# Patient Record
Sex: Male | Born: 1965 | Race: White | Hispanic: No | Marital: Married | Smoking: Never smoker
Health system: Southern US, Community
[De-identification: ages and names within clinical notes are randomized; demographics above are authoritative.]

## PROBLEM LIST (undated history)

## (undated) DIAGNOSIS — Z9109 Other allergy status, other than to drugs and biological substances: Secondary | ICD-10-CM

## (undated) DIAGNOSIS — K219 Gastro-esophageal reflux disease without esophagitis: Secondary | ICD-10-CM

## (undated) DIAGNOSIS — G4730 Sleep apnea, unspecified: Secondary | ICD-10-CM

## (undated) DIAGNOSIS — C61 Malignant neoplasm of prostate: Secondary | ICD-10-CM

## (undated) DIAGNOSIS — R972 Elevated prostate specific antigen [PSA]: Secondary | ICD-10-CM

---
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
## 1997-12-25 ENCOUNTER — Emergency Department (HOSPITAL_COMMUNITY): Admission: EM | Admit: 1997-12-25 | Discharge: 1997-12-25 | Payer: Self-pay

## 2001-12-03 ENCOUNTER — Ambulatory Visit (HOSPITAL_COMMUNITY): Admission: RE | Admit: 2001-12-03 | Discharge: 2001-12-03 | Payer: Self-pay | Admitting: Gastroenterology

## 2001-12-22 ENCOUNTER — Ambulatory Visit (HOSPITAL_COMMUNITY): Admission: RE | Admit: 2001-12-22 | Discharge: 2001-12-22 | Payer: Self-pay | Admitting: Gastroenterology

## 2002-10-06 ENCOUNTER — Ambulatory Visit (HOSPITAL_COMMUNITY): Admission: RE | Admit: 2002-10-06 | Discharge: 2002-10-06 | Payer: Self-pay | Admitting: Gastroenterology

## 2003-09-23 ENCOUNTER — Emergency Department (HOSPITAL_COMMUNITY): Admission: EM | Admit: 2003-09-23 | Discharge: 2003-09-23 | Payer: Self-pay | Admitting: Emergency Medicine

## 2004-06-08 ENCOUNTER — Emergency Department (HOSPITAL_COMMUNITY): Admission: EM | Admit: 2004-06-08 | Discharge: 2004-06-08 | Payer: Self-pay | Admitting: Emergency Medicine

## 2004-10-21 ENCOUNTER — Emergency Department (HOSPITAL_COMMUNITY): Admission: EM | Admit: 2004-10-21 | Discharge: 2004-10-21 | Payer: Self-pay | Admitting: Emergency Medicine

## 2005-03-18 ENCOUNTER — Ambulatory Visit (HOSPITAL_BASED_OUTPATIENT_CLINIC_OR_DEPARTMENT_OTHER): Admission: RE | Admit: 2005-03-18 | Discharge: 2005-03-18 | Payer: Self-pay | Admitting: Family Medicine

## 2005-03-23 ENCOUNTER — Ambulatory Visit: Payer: Self-pay | Admitting: Internal Medicine

## 2011-04-02 ENCOUNTER — Emergency Department (HOSPITAL_COMMUNITY)
Admission: EM | Admit: 2011-04-02 | Discharge: 2011-04-03 | Disposition: A | Discharge status: Home / Self Care | Payer: BC Managed Care – PPO | Attending: Emergency Medicine | Admitting: Emergency Medicine

## 2011-04-02 DIAGNOSIS — S61219A Laceration without foreign body of unspecified finger without damage to nail, initial encounter: Secondary | ICD-10-CM

## 2011-04-02 DIAGNOSIS — S61209A Unspecified open wound of unspecified finger without damage to nail, initial encounter: Secondary | ICD-10-CM | POA: Insufficient documentation

## 2011-04-02 DIAGNOSIS — W261XXA Contact with sword or dagger, initial encounter: Secondary | ICD-10-CM | POA: Insufficient documentation

## 2011-04-02 DIAGNOSIS — W260XXA Contact with knife, initial encounter: Secondary | ICD-10-CM | POA: Insufficient documentation

## 2011-04-02 HISTORY — DX: Other allergy status, other than to drugs and biological substances: Z91.09

## 2011-04-02 HISTORY — DX: Sleep apnea, unspecified: G47.30

## 2011-04-02 NOTE — ED Notes (Signed)
Cut to LT index finger w/ paring knife.  Small lac to top of finger-bleeding controlled.

## 2011-04-02 NOTE — ED Notes (Signed)
Ice pack given to ease pain.  Pt has gauze around finger

## 2011-04-03 MED ORDER — LIDOCAINE HCL 2 % IJ SOLN
INTRAMUSCULAR | Status: AC
  Administered 2011-04-03: 01:00:00
  Filled 2011-04-03: qty 1

## 2011-04-03 MED ORDER — TETANUS-DIPHTH-ACELL PERTUSSIS 5-2.5-18.5 LF-MCG/0.5 IM SUSP
0.5000 mL | Freq: Once | INTRAMUSCULAR | Status: AC
  Administered 2011-04-03: 0.5 mL via INTRAMUSCULAR
  Filled 2011-04-03: qty 0.5

## 2011-04-03 NOTE — ED Provider Notes (Signed)
History     CSN: [id number] Arrival date & time: 04/02/2011  8:45 PM   First MD Initiated Contact with Patient 04/03/11 0110      Chief Complaint  Patient presents with  . Extremity Laceration    LT index finger    (Consider location/radiation/quality/duration/timing/severity/associated sxs/prior treatment) Patient is a 45 y.o. male presenting with skin laceration. The history is provided by the patient.  Laceration  The incident occurred 6 to 12 hours ago. The laceration is located on the left hand. The laceration is 2 cm in size. The laceration mechanism was a a clean knife. The pain is mild. His tetanus status is out of date.    Past Medical History  Diagnosis Date  . Environmental allergies   . Sleep apnea     No past surgical history on file.  No family history on file.  History  Substance Use Topics  . Smoking status: Never Smoker   . Smokeless tobacco: Not on file  . Alcohol Use: 1.8 oz/week    3 Cans of beer per week     daily      Review of Systems  Constitutional: Negative.   Respiratory: Negative.   Cardiovascular: Negative.   Musculoskeletal: Negative.   Skin:       See HPI.  Neurological: Negative.     Allergies  Review of patient's allergies indicates no known allergies.  Home Medications   Current Outpatient Rx  Name Route Sig Dispense Refill  . CETIRIZINE HCL 10 MG PO TABS Oral Take 10 mg by mouth daily.      Marland Kitchen VITAMIN D 1000 UNITS PO TABS Oral Take 1,000 Units by mouth daily.      . OMEGA-3 FATTY ACIDS 1000 MG PO CAPS Oral Take 1 g by mouth daily.      Marland Kitchen FLUTICASONE PROPIONATE 50 MCG/ACT NA SUSP Nasal Place 2 sprays into the nose daily.      . ADULT GUMMY PO CHEW Oral Chew 1 tablet by mouth daily.        BP 126/72  Pulse 59  Temp 98.5 F (36.9 C)  Resp 20  SpO2 98%  Physical Exam  Constitutional: He is oriented to person, place, and time. He appears well-developed and well-nourished.  Neck: Normal range of motion.    Pulmonary/Chest: Effort normal.  Musculoskeletal: Normal range of motion.       2 cm lac to dorsolateral left index finger. FROM without tendon deficits.  Neurological: He is alert and oriented to person, place, and time.  Skin: Skin is warm and dry.  Psychiatric: He has a normal mood and affect.    ED Course  Procedures (including critical care time)  Labs Reviewed - No data to display No results found.   No diagnosis found.    MDM  LACERATION REPAIR Performed by: Rodena Medin Authorized by: Langley Adie A Consent: Verbal consent obtained. Risks and benefits: risks, benefits and alternatives were discussed Consent given by: patient Patient identity confirmed: provided demographic data Prepped and Draped in normal sterile fashion Wound explored  Laceration Location: left index finger distally  Laceration Length: 2cm  No Foreign Bodies seen or palpated  Anesthesia: local infiltration  Local anesthetic: lidocaine 1% w/o epinephrine  Anesthetic total: 1 ml  Irrigation method: syringe Amount of cleaning: standard  Skin closure: 5-0 nylon  Number of sutures: 2  Technique: simple interrupted  Patient tolerance: Patient tolerated the procedure well with no immediate complications.  Rodena Medin, PA 04/03/11 [phone]

## 2011-04-03 NOTE — ED Provider Notes (Signed)
Medical screening examination/treatment/procedure(s) were performed by non-physician practitioner and as supervising physician I was immediately available for consultation/collaboration.  Taisei Bonnette M Sly Parlee, MD 04/03/11 0810 

## 2012-02-17 ENCOUNTER — Other Ambulatory Visit: Payer: Self-pay | Admitting: Family Medicine

## 2012-02-17 ENCOUNTER — Ambulatory Visit
Admission: RE | Admit: 2012-02-17 | Discharge: 2012-02-17 | Disposition: A | Discharge status: Home / Self Care | Payer: No Typology Code available for payment source | Source: Ambulatory Visit | Attending: Family Medicine | Admitting: Family Medicine

## 2012-02-17 DIAGNOSIS — Z0000 Encounter for general adult medical examination without abnormal findings: Secondary | ICD-10-CM

## 2013-06-09 ENCOUNTER — Encounter: Payer: Self-pay | Admitting: General Surgery

## 2013-06-09 DIAGNOSIS — R9431 Abnormal electrocardiogram [ECG] [EKG]: Secondary | ICD-10-CM

## 2013-06-26 IMAGING — CR DG CHEST 2V
2 series · 2 of 2 positions shown · non-contrast
Comparison: None.

CLINICAL DATA: Dive physical exam

CHEST - 2 VIEW

[w chest pa]
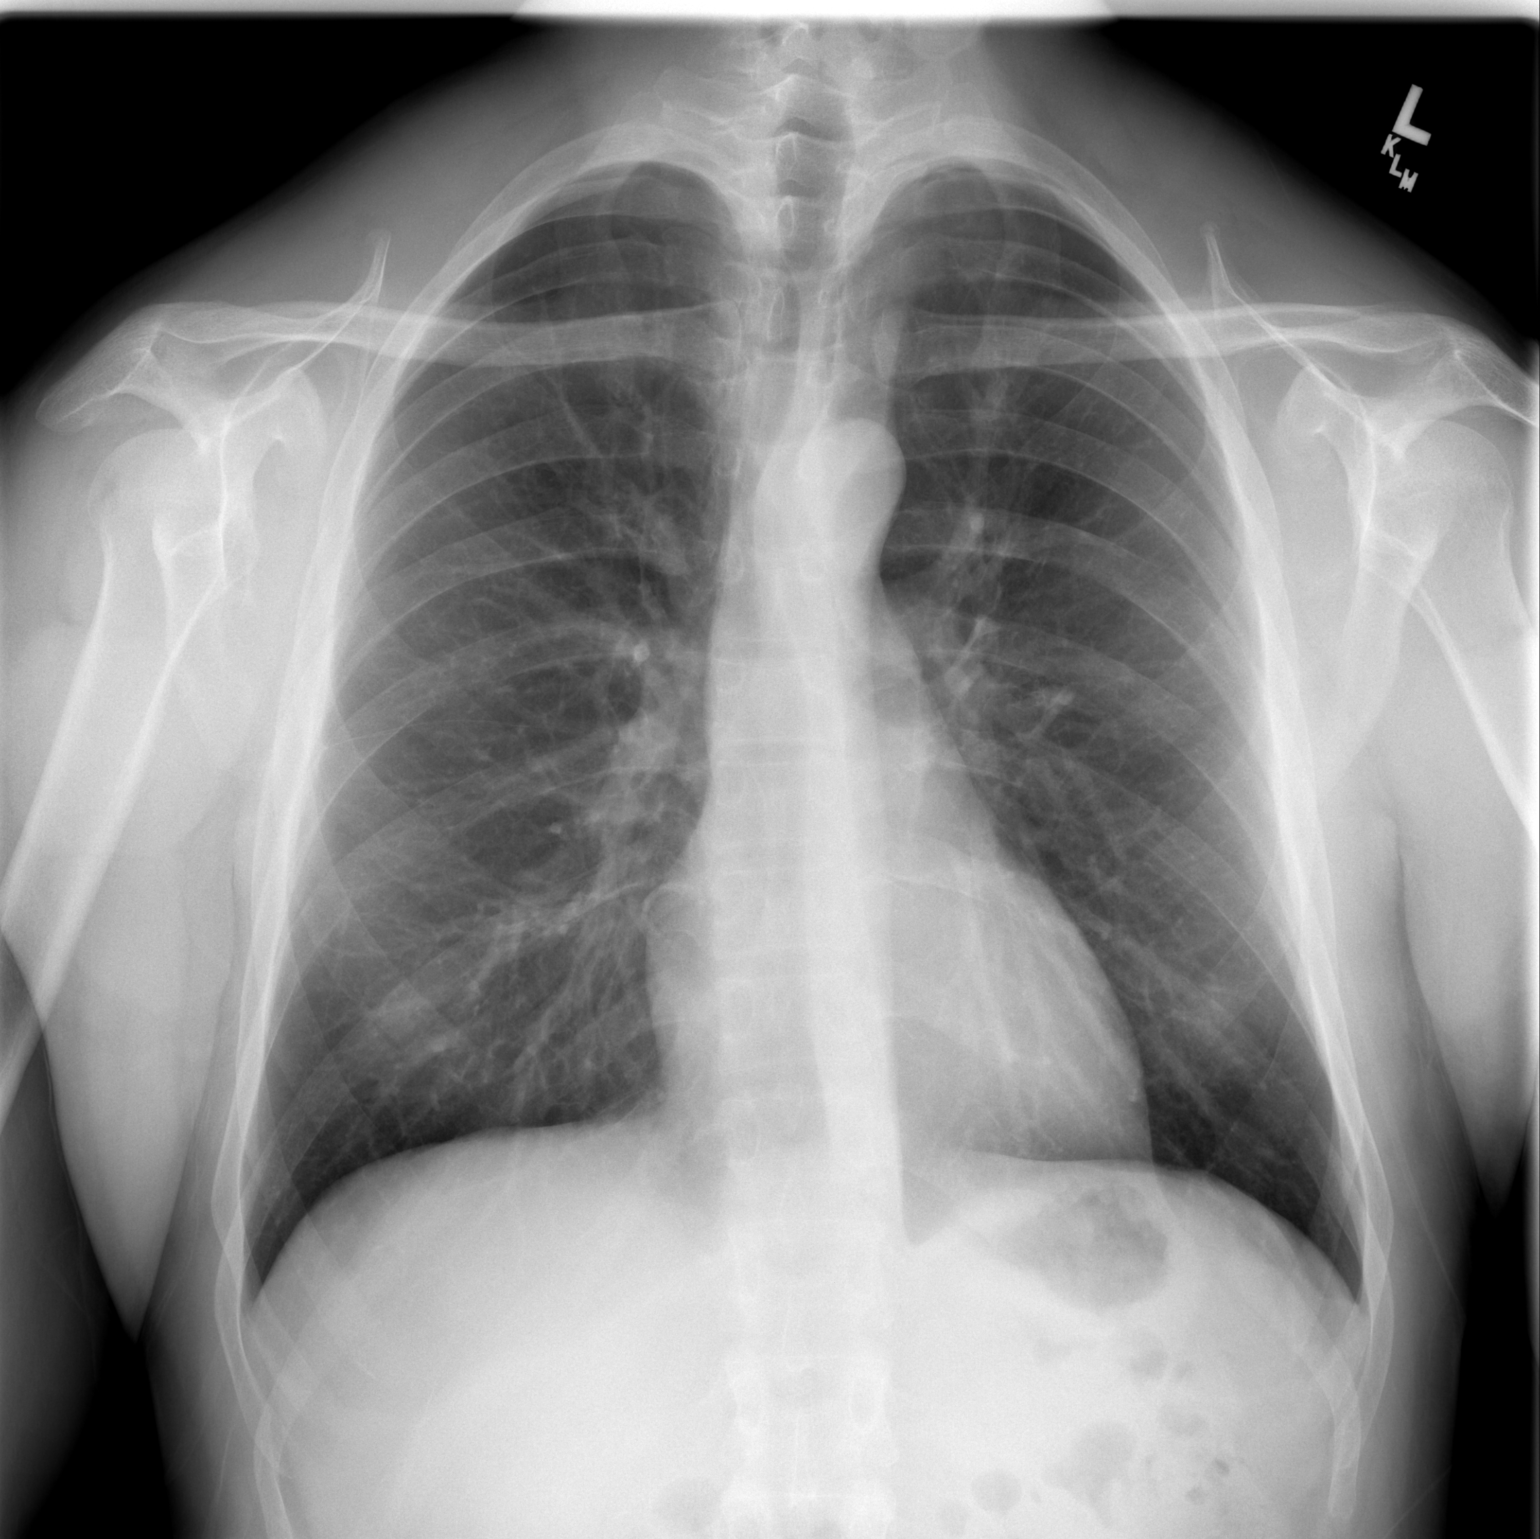

[w chest lat]
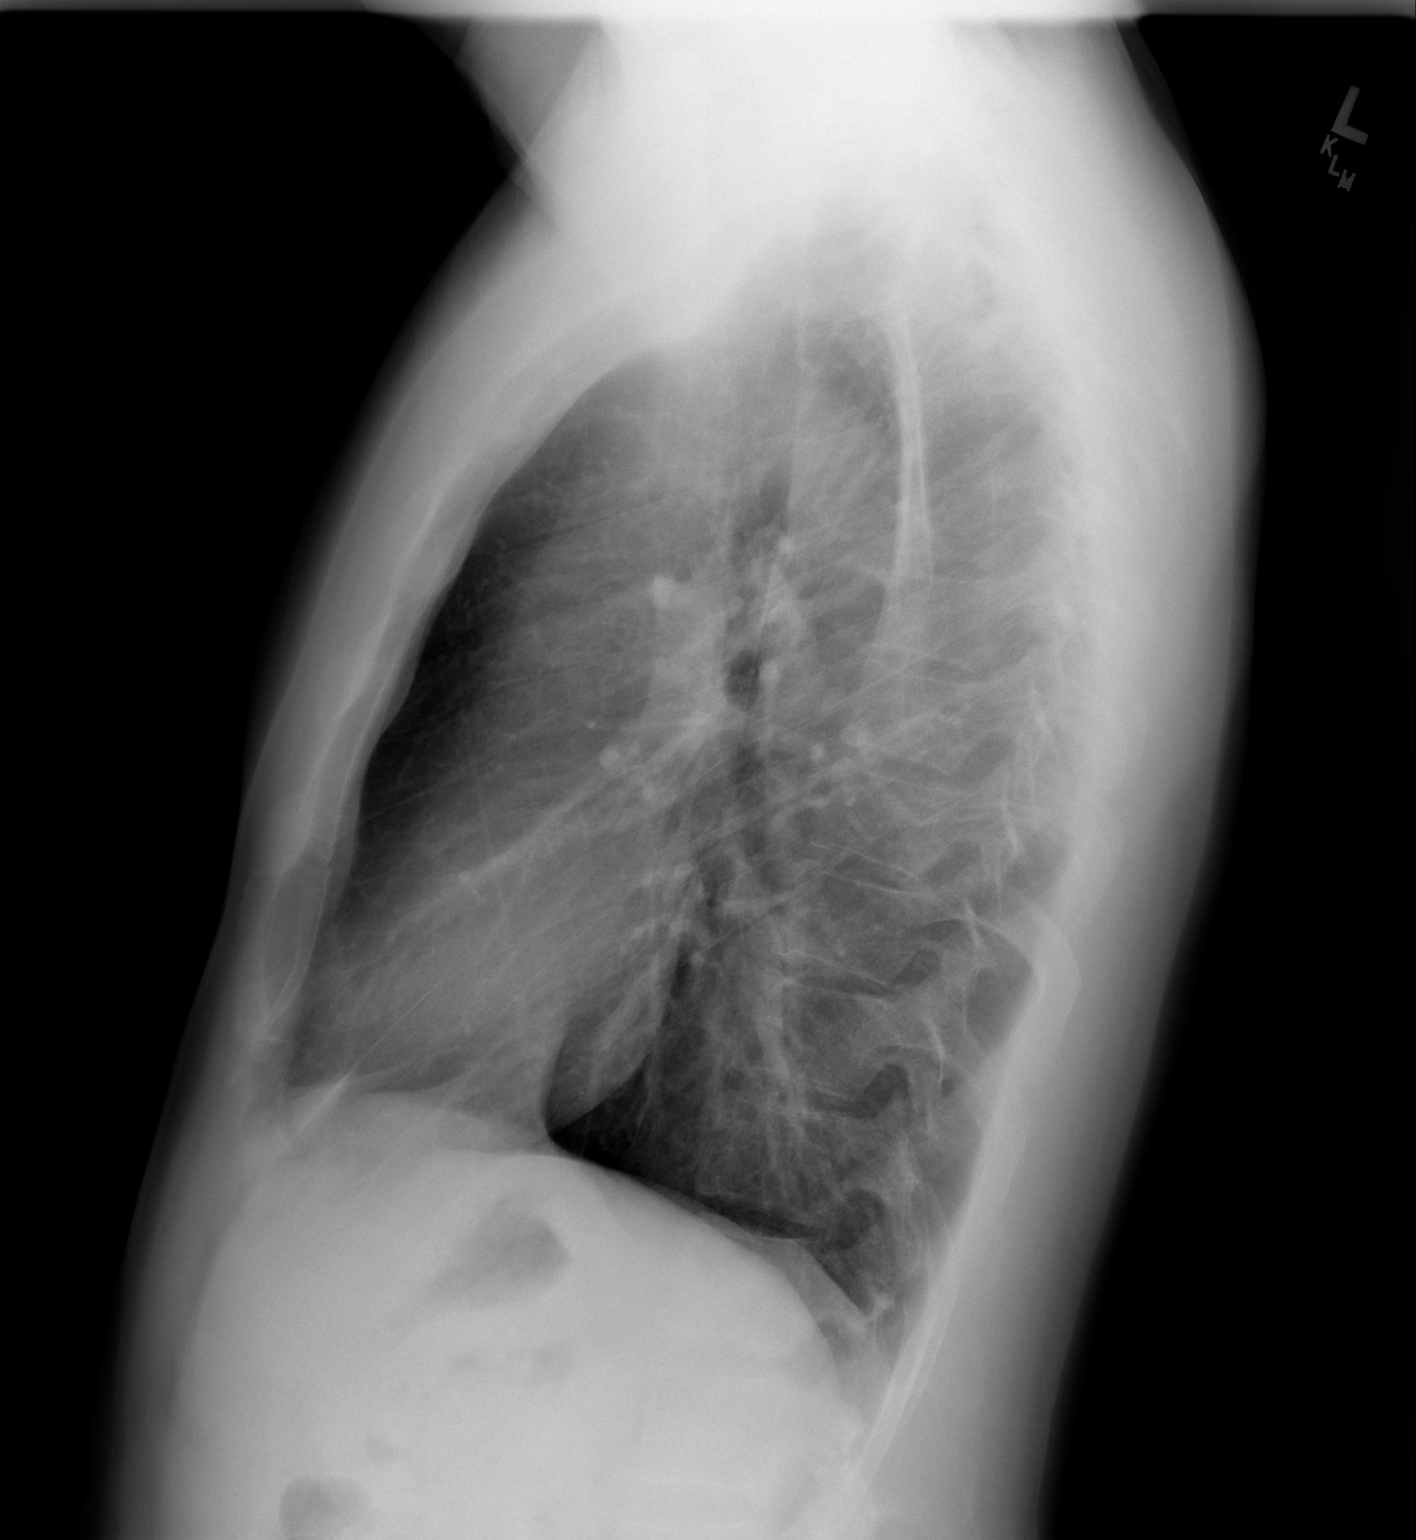

[2 of 2 positions shown; findings below may reference images not displayed]

FINDINGS: The heart size and mediastinal contours are within
normal limits.  Both lungs are clear.  The visualized skeletal
structures are unremarkable.
IMPRESSION: No active cardiopulmonary disease.

## 2014-07-06 ENCOUNTER — Other Ambulatory Visit: Payer: Self-pay | Admitting: Gastroenterology

## 2015-09-26 ENCOUNTER — Encounter (HOSPITAL_COMMUNITY): Payer: Self-pay | Admitting: Family Medicine

## 2015-09-26 ENCOUNTER — Emergency Department (HOSPITAL_COMMUNITY)
Admission: EM | Admit: 2015-09-26 | Discharge: 2015-09-26 | Disposition: A | Discharge status: Home / Self Care | Payer: Worker's Compensation | Attending: Emergency Medicine | Admitting: Emergency Medicine

## 2015-09-26 DIAGNOSIS — Z79899 Other long term (current) drug therapy: Secondary | ICD-10-CM | POA: Diagnosis not present

## 2015-09-26 DIAGNOSIS — IMO0002 Reserved for concepts with insufficient information to code with codable children: Secondary | ICD-10-CM

## 2015-09-26 DIAGNOSIS — Y9289 Other specified places as the place of occurrence of the external cause: Secondary | ICD-10-CM | POA: Diagnosis not present

## 2015-09-26 DIAGNOSIS — Z7951 Long term (current) use of inhaled steroids: Secondary | ICD-10-CM | POA: Diagnosis not present

## 2015-09-26 DIAGNOSIS — Y9389 Activity, other specified: Secondary | ICD-10-CM | POA: Diagnosis not present

## 2015-09-26 DIAGNOSIS — W293XXA Contact with powered garden and outdoor hand tools and machinery, initial encounter: Secondary | ICD-10-CM | POA: Diagnosis not present

## 2015-09-26 DIAGNOSIS — S81012A Laceration without foreign body, left knee, initial encounter: Secondary | ICD-10-CM | POA: Diagnosis not present

## 2015-09-26 DIAGNOSIS — Y998 Other external cause status: Secondary | ICD-10-CM | POA: Diagnosis not present

## 2015-09-26 DIAGNOSIS — Z8669 Personal history of other diseases of the nervous system and sense organs: Secondary | ICD-10-CM | POA: Insufficient documentation

## 2015-09-26 MED ORDER — BACITRACIN ZINC 500 UNIT/GM EX OINT: 1.0000 "application " | TOPICAL_OINTMENT | Freq: Two times a day (BID) | CUTANEOUS | Status: DC

## 2015-09-26 MED ORDER — LIDOCAINE-EPINEPHRINE (PF) 2 %-1:200000 IJ SOLN
20.0000 mL | Freq: Once | INTRAMUSCULAR | Status: AC
  Administered 2015-09-26: 20 mL
  Filled 2015-09-26: qty 20

## 2015-09-26 NOTE — ED Provider Notes (Signed)
CSN: [id number]     Arrival date & time 09/26/15  1214 History  By signing my name below, I, Essence Howell, attest that this documentation has been prepared under the direction and in the presence of Roxy Horsemanobert Chalice Philbert, PA-C. Electronically Signed: Charline BillsEssence Howell, ED Scribe 09/26/2015 at 12:34 PM.  Chief Complaint  Patient presents with  . Extremity Laceration   (Consider location/radiation/quality/duration/timing/severity/associated sxs/prior Treatment) The history is provided by the patient. No language interpreter was used.   HPI Comments: Clayton Smith is a 50 y.o. male who presents to the Emergency Department with a chief complaint of a laceration sustained to his left knee PTA. Pt states he cut it with a chainsaw while he was cutting trees. Pt states his last tetanus shot was given in 2012. His bleeding is controlled. Pt denies any other complaints. Denies any difficulty moving his knee.  Past Medical History  Diagnosis Date  . Environmental allergies   . Sleep apnea    No past surgical history on file. No family history on file. Social History  Substance Use Topics  . Smoking status: Never Smoker   . Smokeless tobacco: Not on file  . Alcohol Use: 1.8 oz/week    3 Cans of beer per week     Comment: daily    Review of Systems  Constitutional: Negative for fever.  Skin: Positive for wound.      Allergies  Review of patient's allergies indicates no known allergies.  Home Medications   Prior to Admission medications   Medication Sig Start Date End Date Taking? Authorizing Provider  cetirizine (ZYRTEC) 10 MG tablet Take 10 mg by mouth daily.      Historical Provider, MD  cholecalciferol (VITAMIN D) 1000 UNITS tablet Take 1,000 Units by mouth daily.      Historical Provider, MD  fish oil-omega-3 fatty acids 1000 MG capsule Take 1 g by mouth daily.      Historical Provider, MD  fluticasone (FLONASE) 50 MCG/ACT nasal spray Place 2 sprays into the nose daily.       Historical Provider, MD  Multiple Vitamins-Minerals (ADULT GUMMY) CHEW Chew 1 tablet by mouth daily.      Historical Provider, MD   Triage Vitals: BP 143/85 mmHg  Pulse 60  Temp(Src) 98 F (36.7 C) (Oral)  Resp 20  SpO2 100% Physical Exam Physical Exam  Constitutional: Pt appears well-developed and well-nourished. No distress.  HENT:  Head: Normocephalic and atraumatic.  Eyes: Conjunctivae are normal.  Neck: Normal range of motion.  Cardiovascular: Normal rate, regular rhythm and intact distal pulses.   Capillary refill < 3 sec  Pulmonary/Chest: Effort normal and breath sounds normal.  Musculoskeletal: Pt exhibits tenderness. Pt exhibits no edema.  ROM: of left knee 5/5 Neurological: Pt  is alert. Coordination normal.  Sensation 5/5 Strength 5/5  Skin: Skin is warm and dry. Pt is not diaphoretic.  No tenting of the skin  Laceration to anterior left knee, no foreign body, it is not deep, no tendon or ligament involvement, no involvement of the joint, the bottom of the wound is easily visualized Psychiatric: Pt has a normal mood and affect.  Nursing note and vitals reviewed.  ED Course  Procedures  DIAGNOSTIC STUDIES: Oxygen Saturation is 100% on RA, normal by my interpretation.    COORDINATION OF CARE: LACERATION REPAIR Performed by: Laurie Pandaichie Mittan, PA-S Authorized by: Roxy HorsemanBROWNING, Abdiaziz Klahn Consent: Verbal consent obtained. Risks and benefits: risks, benefits and alternatives were discussed Consent given by: patient Patient identity confirmed:  provided demographic data Prepped and Draped in normal sterile fashion Wound explored  Laceration Location: left knee  Laceration Length: 6 cm  No Foreign Bodies seen or palpated  Anesthesia: local infiltration  Local anesthetic: lidocaine 2% with epinephrine  Anesthetic total: 6 ml  Irrigation method: syringe Amount of cleaning: standard  Skin closure: 3-0 prolene  Number of sutures: 5  Technique: 4 horizontal  mattress, 1 simple interrupted  Patient tolerance: Patient tolerated the procedure well with no immediate complications.    MDM   Final diagnoses:  Laceration    Patient with laceration over anterior left knee, no bony involvement, no deep tissue, tendon, or ligament involvement. No evidence of joint capsule involvement. Patient has normal range of motion and strength left knee. There are no foreign bodies. The wound was copiously irrigated and repaired in the emergency department. Recommend sutures out in 14 days. Advised patient to limit flexion of left knee. Tetanus shot is up-to-date. Return precautions given. Patient is stable and ready for discharge.  I personally performed the services described in this documentation, which was scribed in my presence. The recorded information has been reviewed and is accurate.      Roxy Horseman, PA-C 09/26/15 1335  Rolland Porter, MD 10/06/15 2101

## 2015-09-26 NOTE — ED Notes (Signed)
Waiting for work note with restrictions.

## 2015-09-26 NOTE — ED Notes (Signed)
Dressing placed over wound with bacitracin.

## 2015-09-26 NOTE — Discharge Instructions (Signed)
Your sutures need to be removed in 14 days.  Return for redness, swelling, pus, or signs of infection.  Be careful in walking and bending your knee, you do not want to tear through the stitches.  Laceration Care, Adult A laceration is a cut that goes through all of the layers of the skin and into the tissue that is right under the skin. Some lacerations heal on their own. Others need to be closed with stitches (sutures), staples, skin adhesive strips, or skin glue. Proper laceration care minimizes the risk of infection and helps the laceration to heal better. HOW TO CARE FOR YOUR LACERATION If sutures or staples were used:  Keep the wound clean and dry.  If you were given a bandage (dressing), you should change it at least one time per day or as told by your health care provider. You should also change it if it becomes wet or dirty.  Keep the wound completely dry for the first 24 hours or as told by your health care provider. After that time, you may shower or bathe. However, make sure that the wound is not soaked in water until after the sutures or staples have been removed.  Clean the wound one time each day or as told by your health care provider:  Wash the wound with soap and water.  Rinse the wound with water to remove all soap.  Pat the wound dry with a clean towel. Do not rub the wound.  After cleaning the wound, apply a thin layer of antibiotic ointmentas told by your health care provider. This will help to prevent infection and keep the dressing from sticking to the wound.  Have the sutures or staples removed as told by your health care provider. If skin adhesive strips were used:  Keep the wound clean and dry.  If you were given a bandage (dressing), you should change it at least one time per day or as told by your health care provider. You should also change it if it becomes dirty or wet.  Do not get the skin adhesive strips wet. You may shower or bathe, but be careful to  keep the wound dry.  If the wound gets wet, pat it dry with a clean towel. Do not rub the wound.  Skin adhesive strips fall off on their own. You may trim the strips as the wound heals. Do not remove skin adhesive strips that are still stuck to the wound. They will fall off in time. If skin glue was used:  Try to keep the wound dry, but you may briefly wet it in the shower or bath. Do not soak the wound in water, such as by swimming.  After you have showered or bathed, gently pat the wound dry with a clean towel. Do not rub the wound.  Do not do any activities that will make you sweat heavily until the skin glue has fallen off on its own.  Do not apply liquid, cream, or ointment medicine to the wound while the skin glue is in place. Using those may loosen the film before the wound has healed.  If you were given a bandage (dressing), you should change it at least one time per day or as told by your health care provider. You should also change it if it becomes dirty or wet.  If a dressing is placed over the wound, be careful not to apply tape directly over the skin glue. Doing that may cause the glue to be  pulled off before the wound has healed.  Do not pick at the glue. The skin glue usually remains in place for 5-10 days, then it falls off of the skin. General Instructions  Take over-the-counter and prescription medicines only as told by your health care provider.  If you were prescribed an antibiotic medicine or ointment, take or apply it as told by your doctor. Do not stop using it even if your condition improves.  To help prevent scarring, make sure to cover your wound with sunscreen whenever you are outside after stitches are removed, after adhesive strips are removed, or when glue remains in place and the wound is healed. Make sure to wear a sunscreen of at least 30 SPF.  Do not scratch or pick at the wound.  Keep all follow-up visits as told by your health care provider. This is  important.  Check your wound every day for signs of infection. Watch for:  Redness, swelling, or pain.  Fluid, blood, or pus.  Raise (elevate) the injured area above the level of your heart while you are sitting or lying down, if possible. SEEK MEDICAL CARE IF:  You received a tetanus shot and you have swelling, severe pain, redness, or bleeding at the injection site.  You have a fever.  A wound that was closed breaks open.  You notice a bad smell coming from your wound or your dressing.  You notice something coming out of the wound, such as wood or glass.  Your pain is not controlled with medicine.  You have increased redness, swelling, or pain at the site of your wound.  You have fluid, blood, or pus coming from your wound.  You notice a change in the color of your skin near your wound.  You need to change the dressing frequently due to fluid, blood, or pus draining from the wound.  You develop a new rash.  You develop numbness around the wound. SEEK IMMEDIATE MEDICAL CARE IF:  You develop severe swelling around the wound.  Your pain suddenly increases and is severe.  You develop painful lumps near the wound or on skin that is anywhere on your body.  You have a red streak going away from your wound.  The wound is on your hand or foot and you cannot properly move a finger or toe.  The wound is on your hand or foot and you notice that your fingers or toes look pale or bluish.   This information is not intended to replace advice given to you by your health care provider. Make sure you discuss any questions you have with your health care provider.   Document Released: 04/21/2005 Document Revised: 09/05/2014 Document Reviewed: 04/17/2014 Elsevier Interactive Patient Education [employer]

## 2015-09-26 NOTE — ED Notes (Signed)
Pt presents from home via POV with c/o laceration to left knee from a chainsaw - he was cutting up some fallen trees in his yard.  Pt is A&O, PMS intact.  Edges are well approximated.

## 2016-04-18 DIAGNOSIS — G4733 Obstructive sleep apnea (adult) (pediatric): Secondary | ICD-10-CM | POA: Diagnosis not present

## 2016-05-20 DIAGNOSIS — Z125 Encounter for screening for malignant neoplasm of prostate: Secondary | ICD-10-CM | POA: Diagnosis not present

## 2016-05-20 DIAGNOSIS — Z23 Encounter for immunization: Secondary | ICD-10-CM | POA: Diagnosis not present

## 2016-05-20 DIAGNOSIS — R7301 Impaired fasting glucose: Secondary | ICD-10-CM | POA: Diagnosis not present

## 2016-05-20 DIAGNOSIS — Z0000 Encounter for general adult medical examination without abnormal findings: Secondary | ICD-10-CM | POA: Diagnosis not present

## 2016-05-20 DIAGNOSIS — Z13220 Encounter for screening for lipoid disorders: Secondary | ICD-10-CM | POA: Diagnosis not present

## 2016-11-27 DIAGNOSIS — Z23 Encounter for immunization: Secondary | ICD-10-CM | POA: Diagnosis not present

## 2016-11-27 DIAGNOSIS — S61012A Laceration without foreign body of left thumb without damage to nail, initial encounter: Secondary | ICD-10-CM | POA: Diagnosis not present

## 2017-02-04 DIAGNOSIS — Z23 Encounter for immunization: Secondary | ICD-10-CM | POA: Diagnosis not present

## 2017-02-04 DIAGNOSIS — M7711 Lateral epicondylitis, right elbow: Secondary | ICD-10-CM | POA: Diagnosis not present

## 2017-02-17 DIAGNOSIS — M7711 Lateral epicondylitis, right elbow: Secondary | ICD-10-CM | POA: Diagnosis not present

## 2017-03-24 DIAGNOSIS — G4733 Obstructive sleep apnea (adult) (pediatric): Secondary | ICD-10-CM | POA: Diagnosis not present

## 2017-05-18 DIAGNOSIS — M25521 Pain in right elbow: Secondary | ICD-10-CM | POA: Diagnosis not present

## 2017-05-21 DIAGNOSIS — M25521 Pain in right elbow: Secondary | ICD-10-CM | POA: Diagnosis not present

## 2017-05-26 DIAGNOSIS — Z13220 Encounter for screening for lipoid disorders: Secondary | ICD-10-CM | POA: Diagnosis not present

## 2017-05-26 DIAGNOSIS — M25521 Pain in right elbow: Secondary | ICD-10-CM | POA: Diagnosis not present

## 2017-05-26 DIAGNOSIS — R7301 Impaired fasting glucose: Secondary | ICD-10-CM | POA: Diagnosis not present

## 2017-05-26 DIAGNOSIS — Z125 Encounter for screening for malignant neoplasm of prostate: Secondary | ICD-10-CM | POA: Diagnosis not present

## 2017-05-26 DIAGNOSIS — Z0000 Encounter for general adult medical examination without abnormal findings: Secondary | ICD-10-CM | POA: Diagnosis not present

## 2017-05-28 DIAGNOSIS — M25521 Pain in right elbow: Secondary | ICD-10-CM | POA: Diagnosis not present

## 2017-06-02 DIAGNOSIS — M25521 Pain in right elbow: Secondary | ICD-10-CM | POA: Diagnosis not present

## 2017-06-04 DIAGNOSIS — M25521 Pain in right elbow: Secondary | ICD-10-CM | POA: Diagnosis not present

## 2017-06-09 DIAGNOSIS — M25521 Pain in right elbow: Secondary | ICD-10-CM | POA: Diagnosis not present

## 2017-06-11 DIAGNOSIS — M25521 Pain in right elbow: Secondary | ICD-10-CM | POA: Diagnosis not present

## 2017-06-23 DIAGNOSIS — M25521 Pain in right elbow: Secondary | ICD-10-CM | POA: Diagnosis not present

## 2017-06-25 DIAGNOSIS — M25521 Pain in right elbow: Secondary | ICD-10-CM | POA: Diagnosis not present

## 2017-06-30 DIAGNOSIS — M25521 Pain in right elbow: Secondary | ICD-10-CM | POA: Diagnosis not present

## 2017-07-09 DIAGNOSIS — M25521 Pain in right elbow: Secondary | ICD-10-CM | POA: Diagnosis not present

## 2017-07-16 DIAGNOSIS — M25521 Pain in right elbow: Secondary | ICD-10-CM | POA: Diagnosis not present

## 2017-07-23 DIAGNOSIS — M25521 Pain in right elbow: Secondary | ICD-10-CM | POA: Diagnosis not present

## 2017-07-30 DIAGNOSIS — M25521 Pain in right elbow: Secondary | ICD-10-CM | POA: Diagnosis not present

## 2018-04-14 DIAGNOSIS — G4733 Obstructive sleep apnea (adult) (pediatric): Secondary | ICD-10-CM | POA: Diagnosis not present

## 2018-05-05 HISTORY — PX: COLONOSCOPY: SHX174

## 2018-05-31 DIAGNOSIS — Z7289 Other problems related to lifestyle: Secondary | ICD-10-CM | POA: Diagnosis not present

## 2018-05-31 DIAGNOSIS — Z13220 Encounter for screening for lipoid disorders: Secondary | ICD-10-CM | POA: Diagnosis not present

## 2018-05-31 DIAGNOSIS — Z23 Encounter for immunization: Secondary | ICD-10-CM | POA: Diagnosis not present

## 2018-05-31 DIAGNOSIS — Z0000 Encounter for general adult medical examination without abnormal findings: Secondary | ICD-10-CM | POA: Diagnosis not present

## 2018-05-31 DIAGNOSIS — Z125 Encounter for screening for malignant neoplasm of prostate: Secondary | ICD-10-CM | POA: Diagnosis not present

## 2019-04-05 DIAGNOSIS — G4733 Obstructive sleep apnea (adult) (pediatric): Secondary | ICD-10-CM | POA: Diagnosis not present

## 2021-06-17 DIAGNOSIS — L723 Sebaceous cyst: Secondary | ICD-10-CM | POA: Diagnosis not present

## 2021-06-17 DIAGNOSIS — R718 Other abnormality of red blood cells: Secondary | ICD-10-CM | POA: Diagnosis not present

## 2021-06-17 DIAGNOSIS — R41840 Attention and concentration deficit: Secondary | ICD-10-CM | POA: Diagnosis not present

## 2021-06-17 DIAGNOSIS — G4733 Obstructive sleep apnea (adult) (pediatric): Secondary | ICD-10-CM | POA: Diagnosis not present

## 2021-06-17 DIAGNOSIS — Z0000 Encounter for general adult medical examination without abnormal findings: Secondary | ICD-10-CM | POA: Diagnosis not present

## 2021-06-17 DIAGNOSIS — Z23 Encounter for immunization: Secondary | ICD-10-CM | POA: Diagnosis not present

## 2021-06-24 DIAGNOSIS — R718 Other abnormality of red blood cells: Secondary | ICD-10-CM | POA: Diagnosis not present

## 2021-06-24 DIAGNOSIS — Z13220 Encounter for screening for lipoid disorders: Secondary | ICD-10-CM | POA: Diagnosis not present

## 2021-06-24 DIAGNOSIS — Z0000 Encounter for general adult medical examination without abnormal findings: Secondary | ICD-10-CM | POA: Diagnosis not present

## 2021-06-28 DIAGNOSIS — J019 Acute sinusitis, unspecified: Secondary | ICD-10-CM | POA: Diagnosis not present

## 2021-07-08 DIAGNOSIS — H53143 Visual discomfort, bilateral: Secondary | ICD-10-CM | POA: Diagnosis not present

## 2022-03-11 DIAGNOSIS — L918 Other hypertrophic disorders of the skin: Secondary | ICD-10-CM | POA: Diagnosis not present

## 2022-05-14 DIAGNOSIS — G4733 Obstructive sleep apnea (adult) (pediatric): Secondary | ICD-10-CM | POA: Diagnosis not present

## 2022-07-16 DIAGNOSIS — Z125 Encounter for screening for malignant neoplasm of prostate: Secondary | ICD-10-CM | POA: Diagnosis not present

## 2022-07-16 DIAGNOSIS — Z23 Encounter for immunization: Secondary | ICD-10-CM | POA: Diagnosis not present

## 2022-07-16 DIAGNOSIS — R41840 Attention and concentration deficit: Secondary | ICD-10-CM | POA: Diagnosis not present

## 2022-07-16 DIAGNOSIS — Z13220 Encounter for screening for lipoid disorders: Secondary | ICD-10-CM | POA: Diagnosis not present

## 2022-07-16 DIAGNOSIS — H9319 Tinnitus, unspecified ear: Secondary | ICD-10-CM | POA: Diagnosis not present

## 2022-07-16 DIAGNOSIS — Z0000 Encounter for general adult medical examination without abnormal findings: Secondary | ICD-10-CM | POA: Diagnosis not present

## 2022-07-16 DIAGNOSIS — L723 Sebaceous cyst: Secondary | ICD-10-CM | POA: Diagnosis not present

## 2022-07-16 DIAGNOSIS — Z1159 Encounter for screening for other viral diseases: Secondary | ICD-10-CM | POA: Diagnosis not present

## 2022-10-03 DIAGNOSIS — H53143 Visual discomfort, bilateral: Secondary | ICD-10-CM | POA: Diagnosis not present

## 2022-10-03 DIAGNOSIS — H5203 Hypermetropia, bilateral: Secondary | ICD-10-CM | POA: Diagnosis not present

## 2022-10-15 DIAGNOSIS — Z23 Encounter for immunization: Secondary | ICD-10-CM | POA: Diagnosis not present

## 2022-10-21 DIAGNOSIS — M25562 Pain in left knee: Secondary | ICD-10-CM | POA: Diagnosis not present

## 2022-10-23 DIAGNOSIS — R41840 Attention and concentration deficit: Secondary | ICD-10-CM | POA: Diagnosis not present

## 2022-10-24 DIAGNOSIS — R41840 Attention and concentration deficit: Secondary | ICD-10-CM | POA: Diagnosis not present

## 2022-11-03 DIAGNOSIS — M25562 Pain in left knee: Secondary | ICD-10-CM | POA: Diagnosis not present

## 2022-11-03 HISTORY — PX: SHOULDER SURGERY: SHX246

## 2022-11-05 DIAGNOSIS — M25512 Pain in left shoulder: Secondary | ICD-10-CM | POA: Diagnosis not present

## 2022-11-07 DIAGNOSIS — M25512 Pain in left shoulder: Secondary | ICD-10-CM | POA: Diagnosis not present

## 2022-11-27 DIAGNOSIS — M25512 Pain in left shoulder: Secondary | ICD-10-CM | POA: Diagnosis not present

## 2022-12-29 DIAGNOSIS — M25512 Pain in left shoulder: Secondary | ICD-10-CM | POA: Diagnosis not present

## 2023-01-14 DIAGNOSIS — M25512 Pain in left shoulder: Secondary | ICD-10-CM | POA: Diagnosis not present

## 2023-01-20 DIAGNOSIS — M25512 Pain in left shoulder: Secondary | ICD-10-CM | POA: Diagnosis not present

## 2023-01-28 DIAGNOSIS — M25512 Pain in left shoulder: Secondary | ICD-10-CM | POA: Diagnosis not present

## 2023-02-03 DIAGNOSIS — M25512 Pain in left shoulder: Secondary | ICD-10-CM | POA: Diagnosis not present

## 2023-02-10 DIAGNOSIS — M25512 Pain in left shoulder: Secondary | ICD-10-CM | POA: Diagnosis not present

## 2023-02-12 DIAGNOSIS — M25562 Pain in left knee: Secondary | ICD-10-CM | POA: Diagnosis not present

## 2023-02-16 DIAGNOSIS — M25512 Pain in left shoulder: Secondary | ICD-10-CM | POA: Diagnosis not present

## 2023-02-17 DIAGNOSIS — M25512 Pain in left shoulder: Secondary | ICD-10-CM | POA: Diagnosis not present

## 2023-02-20 DIAGNOSIS — M25562 Pain in left knee: Secondary | ICD-10-CM | POA: Diagnosis not present

## 2023-02-24 DIAGNOSIS — M25512 Pain in left shoulder: Secondary | ICD-10-CM | POA: Diagnosis not present

## 2023-02-26 DIAGNOSIS — M25562 Pain in left knee: Secondary | ICD-10-CM | POA: Diagnosis not present

## 2023-03-02 DIAGNOSIS — M25562 Pain in left knee: Secondary | ICD-10-CM | POA: Diagnosis not present

## 2023-03-04 DIAGNOSIS — M25512 Pain in left shoulder: Secondary | ICD-10-CM | POA: Diagnosis not present

## 2023-03-10 DIAGNOSIS — M25512 Pain in left shoulder: Secondary | ICD-10-CM | POA: Diagnosis not present

## 2023-03-12 DIAGNOSIS — M25562 Pain in left knee: Secondary | ICD-10-CM | POA: Diagnosis not present

## 2023-03-19 DIAGNOSIS — M25562 Pain in left knee: Secondary | ICD-10-CM | POA: Diagnosis not present

## 2023-03-27 DIAGNOSIS — M25512 Pain in left shoulder: Secondary | ICD-10-CM | POA: Diagnosis not present

## 2023-03-31 DIAGNOSIS — M25562 Pain in left knee: Secondary | ICD-10-CM | POA: Diagnosis not present

## 2023-04-22 DIAGNOSIS — M25562 Pain in left knee: Secondary | ICD-10-CM | POA: Diagnosis not present

## 2023-04-24 DIAGNOSIS — M25512 Pain in left shoulder: Secondary | ICD-10-CM | POA: Diagnosis not present

## 2023-05-05 DIAGNOSIS — M2242 Chondromalacia patellae, left knee: Secondary | ICD-10-CM | POA: Diagnosis not present

## 2023-05-20 DIAGNOSIS — G4733 Obstructive sleep apnea (adult) (pediatric): Secondary | ICD-10-CM | POA: Diagnosis not present

## 2023-08-11 DIAGNOSIS — Z125 Encounter for screening for malignant neoplasm of prostate: Secondary | ICD-10-CM | POA: Diagnosis not present

## 2023-08-11 DIAGNOSIS — Z0000 Encounter for general adult medical examination without abnormal findings: Secondary | ICD-10-CM | POA: Diagnosis not present

## 2023-08-11 DIAGNOSIS — Z13220 Encounter for screening for lipoid disorders: Secondary | ICD-10-CM | POA: Diagnosis not present

## 2023-09-09 DIAGNOSIS — R972 Elevated prostate specific antigen [PSA]: Secondary | ICD-10-CM | POA: Diagnosis not present

## 2023-10-09 DIAGNOSIS — H53143 Visual discomfort, bilateral: Secondary | ICD-10-CM | POA: Diagnosis not present

## 2023-11-04 DIAGNOSIS — N3943 Post-void dribbling: Secondary | ICD-10-CM | POA: Diagnosis not present

## 2023-11-04 DIAGNOSIS — R351 Nocturia: Secondary | ICD-10-CM | POA: Diagnosis not present

## 2023-11-04 DIAGNOSIS — N401 Enlarged prostate with lower urinary tract symptoms: Secondary | ICD-10-CM | POA: Diagnosis not present

## 2023-11-04 DIAGNOSIS — R972 Elevated prostate specific antigen [PSA]: Secondary | ICD-10-CM | POA: Diagnosis not present

## 2023-11-10 ENCOUNTER — Encounter: Payer: Self-pay | Admitting: Urology

## 2023-11-10 ENCOUNTER — Other Ambulatory Visit: Payer: Self-pay | Admitting: Urology

## 2023-11-10 DIAGNOSIS — R972 Elevated prostate specific antigen [PSA]: Secondary | ICD-10-CM

## 2023-11-17 DIAGNOSIS — G4733 Obstructive sleep apnea (adult) (pediatric): Secondary | ICD-10-CM | POA: Diagnosis not present

## 2023-12-17 ENCOUNTER — Ambulatory Visit
Admission: RE | Admit: 2023-12-17 | Discharge: 2023-12-17 | Disposition: A | Discharge status: Home / Self Care | Source: Ambulatory Visit | Attending: Urology | Admitting: Urology

## 2023-12-17 DIAGNOSIS — R972 Elevated prostate specific antigen [PSA]: Secondary | ICD-10-CM

## 2023-12-17 DIAGNOSIS — N400 Enlarged prostate without lower urinary tract symptoms: Secondary | ICD-10-CM | POA: Diagnosis not present

## 2023-12-17 MED ORDER — GADOPICLENOL 0.5 MMOL/ML IV SOLN
8.0000 mL | Freq: Once | INTRAVENOUS | Status: AC | PRN
  Administered 2023-12-17: 8 mL via INTRAVENOUS

## 2023-12-18 DIAGNOSIS — G4733 Obstructive sleep apnea (adult) (pediatric): Secondary | ICD-10-CM | POA: Diagnosis not present

## 2024-02-17 DIAGNOSIS — G4733 Obstructive sleep apnea (adult) (pediatric): Secondary | ICD-10-CM | POA: Diagnosis not present

## 2024-03-09 ENCOUNTER — Other Ambulatory Visit: Payer: Self-pay | Admitting: Unknown Physician Specialty

## 2024-03-09 DIAGNOSIS — N4232 Atypical small acinar proliferation of prostate: Secondary | ICD-10-CM | POA: Diagnosis not present

## 2024-03-09 DIAGNOSIS — R972 Elevated prostate specific antigen [PSA]: Secondary | ICD-10-CM | POA: Diagnosis not present

## 2024-03-09 DIAGNOSIS — C61 Malignant neoplasm of prostate: Secondary | ICD-10-CM | POA: Diagnosis not present

## 2024-03-16 DIAGNOSIS — C61 Malignant neoplasm of prostate: Secondary | ICD-10-CM | POA: Diagnosis not present

## 2024-03-16 DIAGNOSIS — R351 Nocturia: Secondary | ICD-10-CM | POA: Diagnosis not present

## 2024-03-16 DIAGNOSIS — R3912 Poor urinary stream: Secondary | ICD-10-CM | POA: Diagnosis not present

## 2024-03-16 DIAGNOSIS — N401 Enlarged prostate with lower urinary tract symptoms: Secondary | ICD-10-CM | POA: Diagnosis not present

## 2024-03-30 ENCOUNTER — Telehealth: Payer: Self-pay

## 2024-03-30 NOTE — Telephone Encounter (Signed)
 LVM that a packet was sent for appointment left contact information and advised a call would be made as time got closer to answer any additional questions and to make sure the packet was received

## 2024-04-14 LAB — SURGICAL PATHOLOGY

## 2024-04-14 NOTE — Progress Notes (Unsigned)
 [facility] CONSULT NOTE  Patient Care Team: Sun, Vyvyan, MD as PCP - General (Family Medicine) Vertell Pont, RN as Oncology Nurse Navigator  ASSESSMENT & PLAN:  Clayton Smith is a 58 y.o.male with unremarkable medical history being seen at [facility] for prostate cancer.  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 favorable intermediate risk. Treatment: Patient elected on surgery  His case was discussed at tumor board with multiple specialists including radiation oncologist, urology oncologist, pathologist, radiologist. The patient was counseled on the natural history of prostate cancer and the standard treatment options that are available for prostate cancer.   Patient has elected to proceed with surgery based on personal preference. Patient will follow-up with radiation oncology or urologic oncology for definitive treatment.  He may follow-up with medical oncology as needed. Discussed ongoing surveillance through Urology after definitive treatment.  He understands. Assessment & Plan Malignant neoplasm of prostate [facility]) Follow-up with urology for definitive treatment Healthy lifestyle to prevent diabetes and CV disease Weight-bearing exercises (30 minutes per day) Limit alcohol consumption and avoid smoking  All questions were answered. The patient knows to call the clinic with any problems, questions or concerns. No barriers to learning was detected.  Pauletta JAYSON Chihuahua, MD 12/12/202511:48 AM  CHIEF COMPLAINTS/PURPOSE OF CONSULTATION:  prostate cancer  HISTORY OF PRESENTING ILLNESS:  Clayton Smith 58 y.o. male is here because of prostate cancer.  I have reviewed his chart and materials related to his cancer extensively and collaborated history with the patient. Summary of oncologic history is as follows:  Patient was found to have elevated PSA from primary physician Dr. Nichole.  He was referred to urology for evaluation.   Report of DRE was normal without palpable nodule or induration.  MRI from August showed no focal lesion.  PSA as below.  Patient underwent biopsy and showed 2 cores positive for 3+4 GG2 adenocarcinoma.  5 other cores positive for 3+3 adenocarcinoma.  06/15/2020 PSA 3.73 07/16/2022 PSA 3.63 08/11/23 PSA 4.9 09/09/23 PSA 5.2.  12/17/23 MRI prostate  1. No focal lesion of intermediate or higher suspicion for prostate cancer is identified. 2. Mild prostatomegaly and benign prostatic hypertrophy.  03/09/24 prostate biopsy.  Gleason score 3+4 in 2 cores.  3+3 and 5 cores  MEDICAL HISTORY:  Past Medical History:  Diagnosis Date   Elevated PSA    Environmental allergies    Prostate cancer (HCC)    Right knee pain 03/2023   PEP injection   Sleep apnea     SURGICAL HISTORY: Past Surgical History:  Procedure Laterality Date   COLONOSCOPY  2020   PROSTATE BIOPSY     SHOULDER SURGERY  11/2022   torn rotator cuff   URETHRAL DILATION  1970    SOCIAL HISTORY: Social History   Socioeconomic History   Marital status: Married    Spouse name: Not on file   Number of children: Not on file   Years of education: Not on file   Highest education level: Not on file  Occupational History   Not on file  Tobacco Use   Smoking status: Never   Smokeless tobacco: Never  Vaping Use   Vaping status: Never Used  Substance and Sexual Activity   Alcohol use: Yes    Alcohol/week: 14.0 standard drinks of alcohol    Types: 14 Cans of beer per week    Comment: daily   Drug use: No   Sexual activity: Yes  Other Topics Concern   Not on file  Social History Narrative   Not on file   Social Drivers of Health   Tobacco Use: Low Risk (04/15/2024)   Patient History    Smoking Tobacco Use: Never    Smokeless Tobacco Use: Never    Passive Exposure: Not on file  Financial Resource Strain: Not on file  Food Insecurity: No Food Insecurity (04/15/2024)   Epic    Worried About Programme Researcher, Broadcasting/film/video in the  Last Year: Never true    Ran Out of Food in the Last Year: Never true  Transportation Needs: No Transportation Needs (04/15/2024)   Epic    Lack of Transportation (Medical): No    Lack of Transportation (Non-Medical): No  Physical Activity: Not on file  Stress: Not on file  Social Connections: Not on file  Intimate Partner Violence: Not At Risk (04/15/2024)   Epic    Fear of Current or Ex-Partner: No    Emotionally Abused: No    Physically Abused: No    Sexually Abused: No  Depression (PHQ2-9): Low Risk (04/15/2024)   Depression (PHQ2-9)    PHQ-2 Score: 0  Alcohol Screen: Not on file  Housing: Unknown (04/15/2024)   Epic    Unable to Pay for Housing in the Last Year: No    Number of Times Moved in the Last Year: Not on file    Homeless in the Last Year: No  Utilities: Not At Risk (04/15/2024)   Epic    Threatened with loss of utilities: No  Health Literacy: Not on file    FAMILY HISTORY: Family History  Problem Relation Age of Onset   Leukemia Father    Leukemia Maternal Uncle    Lung cancer Maternal Grandfather     ALLERGIES:  has no known allergies.  MEDICATIONS:  Current Outpatient Medications  Medication Sig Dispense Refill   acetaminophen (TYLENOL) 325 MG tablet Take 650 mg by mouth every 6 (six) hours as needed for moderate pain (pain score 4-6).     fexofenadine-pseudoephedrine (ALLEGRA-D 24) 180-240 MG 24 hr tablet Take 1 tablet by mouth as needed (allergies).     guaiFENesin (MUCINEX) 600 MG 12 hr tablet Take 600 mg by mouth as needed for cough or to loosen phlegm.     Psyllium (METAMUCIL 4 IN 1 FIBER) 43 % POWD Take 2 Scoops by mouth 2 (two) times daily.     Respiratory Therapy Supplies (CARETOUCH 2 CPAP HOSE HANGER) MISC CPAP     No current facility-administered medications for this visit.    REVIEW OF SYSTEMS:   All relevant systems were reviewed with the patient and are negative.  PHYSICAL EXAMINATION: ECOG PERFORMANCE STATUS: 0  There were no  vitals filed for this visit. There were no vitals filed for this visit.  GENERAL: alert, no distress and comfortable SKIN: skin color is normal, no jaundice LUNGS: Effort normal, no respiratory distress.    LABORATORY & PATHOLOGY DATA:  I have reviewed the results of labs, PSA and biopsy results related to his cancer.  RADIOGRAPHIC STUDIES: I have reviewed the radiological images related to his cancer during tumor board meeting.

## 2024-04-14 NOTE — Progress Notes (Unsigned)
° °                              Care Plan Summary  Name: Clayton Smith DOB: 1966/03/19   Your Medical Team:   Urologist -  Dr. Gretel Ferrara, [facility]  Radiation Oncologist - Dr. Donnice Barge, [facility]   Medical Oncologist - Dr. Pauletta Chihuahua, [facility]  Recommendations: Surgery  Radiation   * These recommendations are based on information available as of todays consult.      Recommendations may change depending on the results of further tests or exams.    Next Steps: 1) Consider all your options and contact Vertell, your nurse navigator with any questions or treatment decision.    When appointments need to be scheduled, you will be contacted by [facility] and/or [facility].  Questions?  Please do not hesitate to call Vertell Pont, BSN, RN at [phone] with any questions or concerns.  Vertell is your Oncology Nurse Navigator and is available to assist you while youre receiving your medical care at [facility].

## 2024-04-14 NOTE — Progress Notes (Signed)
 I called pt to introduce myself as  the Coordinator of the [facility].   1. I confirmed with the patient he is aware of his referral to the clinic 12/12, arriving @ 8:00 a.m..    2. I discussed the format of the clinic and the physicians he will be seeing that day.   3. I discussed where the clinic is located and how to contact me.   4. I confirmed his address and informed him I would be mailing a packet of information and forms to be completed. I asked him to bring them with him the day of his appointment.    He voiced understanding of the above. I asked him to call me if he has any questions or concerns regarding his appointments or the forms he needs to complete.

## 2024-04-14 NOTE — Progress Notes (Signed)
 Radiation Oncology         [phone] ________________________________  Multidisciplinary Prostate Cancer Clinic  Initial Radiation Oncology Consultation  Name: Clayton Smith MRN: [id number]  Date: 04/15/2024  DOB: 28-Jun-1965  CC:Sun, Vyvyan, MD  Elisabeth Valli BIRCH, MD   REFERRING PHYSICIAN: Elisabeth Valli BIRCH, MD  DIAGNOSIS: 59 y.o. gentleman with stage T1c adenocarcinoma of the prostate with a Gleason's score of 3+4 and a PSA of 5.2.    ICD-10-CM   1. Malignant neoplasm of prostate (HCC)  C61       HISTORY OF PRESENT ILLNESS::Clayton Smith is a 58 y.o. gentleman.  He was noted to have an elevated PSA of 5.2 and this remained elevated at 4.9 when repeated by his primary care physician, Dr. Austin.  Accordingly, he was referred for evaluation in urology by Dr. Elisabeth on 11/04/23,  digital rectal examination performed at that time showed no nodules or induration. He underwent a prostate MRI on 12/17/23 showing no focal lesion. The patient proceeded to transrectal ultrasound with 12 biopsies of the prostate on 03/09/24.  The prostate volume measured 46.8 cc.  Out of 12 core biopsies, 7 were positive.  The maximum Gleason score was 3+4, and this was seen in the left lateral base and left base. Additionally, Gleason 3+3 was seen in the right base, right lateral mid, right mid, right lateral apex, and left lateral apex.  The patient reviewed the biopsy results with his urologist and he has kindly been referred today to the multidisciplinary prostate cancer clinic for presentation of pathology and radiology studies in our conference for discussion of potential radiation treatment options and clinical evaluation.  PREVIOUS RADIATION THERAPY: No  PAST MEDICAL HISTORY:  has a past medical history of Elevated PSA, Environmental allergies, Prostate cancer (HCC), Right knee pain (03/2023), and Sleep apnea.    PAST SURGICAL HISTORY: Past Surgical History:  Procedure Laterality Date   COLONOSCOPY   2020   PROSTATE BIOPSY     SHOULDER SURGERY  11/2022   torn rotator cuff   URETHRAL DILATION  1970    FAMILY HISTORY: family history includes Leukemia in his father and maternal uncle; Lung cancer in his maternal grandfather.  SOCIAL HISTORY:  reports that he has never smoked. He has never used smokeless tobacco. He reports current alcohol use of about 14.0 standard drinks of alcohol per week. He reports that he does not use drugs.  ALLERGIES: Patient has no known allergies.  MEDICATIONS:  Current Outpatient Medications  Medication Sig Dispense Refill   acetaminophen (TYLENOL) 325 MG tablet Take 650 mg by mouth every 6 (six) hours as needed for moderate pain (pain score 4-6).     fexofenadine-pseudoephedrine (ALLEGRA-D 24) 180-240 MG 24 hr tablet Take 1 tablet by mouth as needed (allergies).     Psyllium (METAMUCIL 4 IN 1 FIBER) 43 % POWD Take 2 Scoops by mouth 2 (two) times daily.     guaiFENesin (MUCINEX) 600 MG 12 hr tablet Take 600 mg by mouth as needed for cough or to loosen phlegm.     Respiratory Therapy Supplies (CARETOUCH 2 CPAP HOSE HANGER) MISC CPAP     No current facility-administered medications for this encounter.    REVIEW OF SYSTEMS:  On review of systems, the patient reports that he is doing well overall. He denies any chest pain, shortness of breath, cough, fevers, chills, night sweats, unintended weight changes. He denies any bowel disturbances, and denies abdominal pain, nausea or vomiting. He denies any new  musculoskeletal or joint aches or pains. His IPSS was 12, indicating moderate urinary symptoms. His SHIM was 18, indicating he has mild erectile dysfunction. A complete review of systems is obtained and is otherwise negative.   PHYSICAL EXAM:  Wt Readings from Last 3 Encounters:  04/15/24 191 lb 2 oz (86.7 kg)   Temp Readings from Last 3 Encounters:  04/15/24 (!) 97.3 F (36.3 C) (Temporal)  09/26/15 97.6 F (36.4 C) (Oral)  04/02/11 98.5 F (36.9 C)    BP Readings from Last 3 Encounters:  04/15/24 (!) 150/94  09/26/15 141/96  04/02/11 126/72   Pulse Readings from Last 3 Encounters:  04/15/24 63  09/26/15 (!) 56  04/02/11 (!) 59    /10  In general this is a well appearing Caucasian man in no acute distress. He's alert and oriented x4 and appropriate throughout the examination. Cardiopulmonary assessment is negative for acute distress and he exhibits normal effort.    KPS = 100  100 - Normal; no complaints; no evidence of disease. 90   - Able to carry on normal activity; minor signs or symptoms of disease. 80   - Normal activity with effort; some signs or symptoms of disease. 16   - Cares for self; unable to carry on normal activity or to do active work. 60   - Requires occasional assistance, but is able to care for most of his personal needs. 50   - Requires considerable assistance and frequent medical care. 40   - Disabled; requires special care and assistance. 30   - Severely disabled; hospital admission is indicated although death not imminent. 20   - Very sick; hospital admission necessary; active supportive treatment necessary. 10   - Moribund; fatal processes progressing rapidly. 0     - Dead  Karnofsky DA, Abelmann WH, Craver LS and Burchenal JH [phone]) The use of the nitrogen mustards in the palliative treatment of carcinoma: with particular reference to bronchogenic carcinoma Cancer 1 [phone]   LABORATORY DATA:  No results found for: WBC, HGB, HCT, MCV, PLT No results found for: NA, K, CL, CO2 No results found for: ALT, AST, GGT, ALKPHOS, BILITOT   RADIOGRAPHY: No results found.    IMPRESSION/PLAN: 58 y.o. gentleman with Stage T1c adenocarcinoma of the prostate with a Gleason score of 3+4 and a PSA of 5.2.    We discussed the patient's workup and outlined the nature of prostate cancer in this setting. The patient's T stage, Gleason's score, and PSA put him into the favorable intermediate  risk group. Accordingly, he is eligible for a variety of potential treatment options including brachytherapy, 5.5 weeks of external radiation, or prostatectomy. We discussed the available radiation techniques, and focused on the details and logistics of delivery. We discussed and outlined the risks, benefits, short and long-term effects associated with radiotherapy and compared and contrasted these with prostatectomy. We discussed the role of SpaceOAR gel in reducing the rectal toxicity associated with radiotherapy.   The patient focused most of his questions and interest in robotic-assisted laparoscopic radical prostatectomy.  We discussed some of the potential advantages of surgery including surgical staging, the availability of salvage radiotherapy to the prostatic fossa, and the confidence associated with immediate biochemical response. We discussed some of the potential proven indications for postoperative radiotherapy including positive margins, extracapsular extension, and seminal vesicle involvement. We also talked about some of the other potential findings leading to a recommendation for radiotherapy including a non-zero postoperative PSA and positive lymph nodes.  He appears to have a good understanding of his disease and our treatment recommendations which are of curative intent.  He was encouraged to ask questions that were answered to his stated satisfaction and at the conclusion of our conversation, he remains undecided but appears to be leaning towards proceeding with prostatectomy.  He will discuss this further with Dr. Renda as part of our multidisciplinary clinic today.  We enjoyed meeting with him today, and will look forward to following his progress.  Of course, if he ultimately elects to proceed with radiation, we would be more than happy to offer treatment.  He has our contact information and will let us  know if he has any additional questions or concerns related to our treatment  discussion today.  We personally spent 60 minutes in this encounter including chart review, reviewing radiological studies, meeting face-to-face with the patient, entering orders and completing documentation.    Sabra MICAEL Rusk, PA-C    Donnice Barge, MD  [facility]  Radiation Oncology Direct Dial: [phone]  Fax: [phone] [email or web address]  Skype  [email or web address]   This document serves as a record of services personally performed by Donnice Barge, MD and Sabra Rusk, PA-C. It was created on their behalf by Izetta Neither, a trained medical scribe. The creation of this record is based on the scribe's personal observations and the provider's statements to them. This document has been checked and approved by the attending provider.

## 2024-04-15 ENCOUNTER — Inpatient Hospital Stay

## 2024-04-15 ENCOUNTER — Encounter: Payer: Self-pay | Admitting: Radiation Oncology

## 2024-04-15 ENCOUNTER — Encounter: Payer: Self-pay | Admitting: Urology

## 2024-04-15 ENCOUNTER — Ambulatory Visit
Admission: RE | Admit: 2024-04-15 | Discharge: 2024-04-15 | Disposition: A | Source: Ambulatory Visit | Attending: Radiation Oncology | Admitting: Radiation Oncology

## 2024-04-15 VITALS — BP 150/94 | HR 63 | Temp 97.3°F | Resp 18 | Ht 71.0 in | Wt 191.1 lb

## 2024-04-15 DIAGNOSIS — C61 Malignant neoplasm of prostate: Secondary | ICD-10-CM

## 2024-04-15 DIAGNOSIS — Z806 Family history of leukemia: Secondary | ICD-10-CM | POA: Insufficient documentation

## 2024-04-15 DIAGNOSIS — Z801 Family history of malignant neoplasm of trachea, bronchus and lung: Secondary | ICD-10-CM | POA: Insufficient documentation

## 2024-04-15 DIAGNOSIS — Z191 Hormone sensitive malignancy status: Secondary | ICD-10-CM | POA: Diagnosis not present

## 2024-04-15 HISTORY — DX: Malignant neoplasm of prostate: C61

## 2024-04-15 HISTORY — DX: Elevated prostate specific antigen (PSA): R97.20

## 2024-04-15 NOTE — Assessment & Plan Note (Addendum)
 Follow-up with urology for definitive treatment Healthy lifestyle to prevent diabetes and CV disease Weight-bearing exercises (30 minutes per day) Limit alcohol consumption and avoid smoking

## 2024-04-22 NOTE — Progress Notes (Signed)
 RN spoke with patient to follow up after recent PMDC visit.  Patient will proceed with surgery, Dr. Renda notified.    No additional needs at this time. Pending surgery date.   Plan of care in progress.

## 2024-05-12 NOTE — Progress Notes (Signed)
 RN spoke with patient to review questions related to RALP.   All questions answered.  Surgery date pending at this time.  Patient knows to call with any questions that may arise.

## 2024-05-19 ENCOUNTER — Other Ambulatory Visit: Payer: Self-pay | Admitting: Urology

## 2024-05-19 NOTE — Progress Notes (Signed)
 Patient is now scheduled for RALP on 06/09/24 with Dr. Renda.

## 2024-05-31 NOTE — Progress Notes (Signed)
 Date of COVID positive in last 90 days:  PCP - Arnett Repress, MD Cardiologist - n/a  Chest x-ray - N/A EKG - 06/01/24 Epic/chart Stress Test - N/A ECHO - N/A Cardiac Cath - N/A Pacemaker/ICD device last checked:N/A Spinal Cord Stimulator:N/A  Bowel Prep - clears day before, drink mag citrate, use fleet enema  Sleep Study - yes CPAP - every night   Fasting Blood Sugar - N/A Checks Blood Sugar _____ times a day  Last dose of GLP1 agonist-  N/A GLP1 instructions:  Do not take after     Last dose of SGLT-2 inhibitors-  N/A SGLT-2 instructions:  Do not take after     Blood Thinner Instructions: N/A Last dose:   Time: Aspirin Instructions:N/A Last Dose:  Activity level: Can go up a flight of stairs and perform activities of daily living without stopping and without symptoms of chest pain or shortness of breath.  Anesthesia review: BP 161/102 and 144/99 at PAT appointment. Patient reports being nervous but no other symptoms. Denies history of HTN. States his BP has been on the high side since he was diagnosed with cancer. Will obtain EKG. Per PA patient should monitor his BP at home and follow up with his PCP.  Patient denies shortness of breath, fever, cough and chest pain at PAT appointment  Patient verbalized understanding of instructions that were given to them at the PAT appointment. Patient was also instructed that they will need to review over the PAT instructions again at home before surgery.

## 2024-05-31 NOTE — Patient Instructions (Signed)
 SURGICAL WAITING ROOM VISITATION  Patients having surgery or a procedure may have no more than 2 support people in the waiting area - these visitors may rotate.    Children ages 1 and under will not be able to visit patients in [facility] under most circumstances.   Visitors with respiratory illnesses are discouraged from visiting and should remain at home.  If the patient needs to stay at the hospital during part of their recovery, the visitor guidelines for inpatient rooms apply. Pre-op nurse will coordinate an appropriate time for 1 support person to accompany patient in pre-op.  This support person may not rotate.    Please refer to the [facility] website for the visitor guidelines for Inpatients (after your surgery is over and you are in a regular room).    Your procedure is scheduled on: 06/09/24   Report to [facility] Main Entrance    Report to admitting at 9:00 AM   Call this number if you have problems the morning of surgery [phone]   Follow a clear liquid diet the day before surgery.  Water Non-Citrus Juices (without pulp, NO RED-Apple, White grape, White cranberry) Black Coffee (NO MILK/CREAM OR CREAMERS, sugar ok)  Clear Tea (NO MILK/CREAM OR CREAMERS, sugar ok) regular and decaf                             Plain Jell-O (NO RED)                                           Fruit ices (not with fruit pulp, NO RED)                                     Popsicles (NO RED)                                                               Sports drinks like Gatorade (NO RED)              Nothing to drink after midnight.           If you have questions, please contact your surgeons office.   FOLLOW BOWEL PREP AND ANY ADDITIONAL PRE OP INSTRUCTIONS YOU RECEIVED FROM YOUR SURGEON'S OFFICE!!!     Oral Hygiene is also important to reduce your risk of infection.                                    Remember - BRUSH YOUR TEETH THE MORNING OF SURGERY WITH YOUR  REGULAR TOOTHPASTE  DENTURES WILL BE REMOVED PRIOR TO SURGERY PLEASE DO NOT APPLY Poly grip OR ADHESIVES!!!   Stop all vitamins and herbal supplements 7 days before surgery.   Take these medicines the morning of surgery with A SIP OF WATER: Tylenol  Bring CPAP mask and tubing day of surgery.  You may not have any metal on your body including jewelry, and body piercing             Do not wear lotions, powders, cologne, or deodorant              Men may shave face and neck.   Do not bring valuables to the hospital. [facility] IS NOT             RESPONSIBLE   FOR VALUABLES.   Contacts, glasses, dentures or bridgework may not be worn into surgery.   Bring small overnight bag day of surgery.   DO NOT BRING YOUR HOME MEDICATIONS TO THE HOSPITAL. PHARMACY WILL DISPENSE MEDICATIONS LISTED ON YOUR MEDICATION LIST TO YOU DURING YOUR ADMISSION IN THE HOSPITAL!    Special Instructions: Bring a copy of your healthcare power of attorney and living will documents the day of surgery if you haven't scanned them before.              Please read over the following fact sheets you were given: IF YOU HAVE QUESTIONS ABOUT YOUR PRE-OP INSTRUCTIONS PLEASE CALL [phone].   If you received a COVID test during your pre-op visit  it is requested that you wear a mask when out in public, stay away from anyone that may not be feeling well and notify your surgeon if you develop symptoms. If you test positive for Covid or have been in contact with anyone that has tested positive in the last 10 days please notify you surgeon.    [facility] - Preparing for Surgery Before surgery, you can play an important role.  Because skin is not sterile, your skin needs to be as free of germs as possible.  You can reduce the number of germs on your skin by washing with CHG (chlorahexidine gluconate) soap before surgery.  CHG is an antiseptic cleaner which kills germs and bonds with the  skin to continue killing germs even after washing. Please DO NOT use if you have an allergy to CHG or antibacterial soaps.  If your skin becomes reddened/irritated stop using the CHG and inform your nurse when you arrive at Short Stay. Do not shave (including legs and underarms) for at least 48 hours prior to the first CHG shower.  You may shave your face/neck.  Please follow these instructions carefully:  1.  Shower with CHG Soap the night before surgery ONLY (DO NOT USE THE SOAP THE MORNING OF SURGERY).  2.  If you choose to wash your hair, wash your hair first as usual with your normal  shampoo.  3.  After you shampoo, rinse your hair and body thoroughly to remove the shampoo.                             4.  Use CHG as you would any other liquid soap.  You can apply chg directly to the skin and wash.  Gently with a scrungie or clean washcloth.  5.  Apply the CHG Soap to your body ONLY FROM THE NECK DOWN.   Do   not use on face/ open                           Wound or open sores. Avoid contact with eyes, ears mouth and   genitals (private parts).  Wash face,  Genitals (private parts) with your normal soap.             6.  Wash thoroughly, paying special attention to the area where your    surgery  will be performed.  7.  Thoroughly rinse your body with warm water from the neck down.  8.  DO NOT shower/wash with your normal soap after using and rinsing off the CHG Soap.                9.  Pat yourself dry with a clean towel.            10.  Wear clean pajamas.            11.  Place clean sheets on your bed the night of your first shower and do not  sleep with pets. Day of Surgery : Do not apply any CHG, lotions/deodorants the morning of surgery.  Please wear clean clothes to the hospital/surgery center.  FAILURE TO FOLLOW THESE INSTRUCTIONS MAY RESULT IN THE CANCELLATION OF YOUR SURGERY  PATIENT SIGNATURE_________________________________  NURSE  SIGNATURE__________________________________  ________________________________________________________________________ WHAT IS A BLOOD TRANSFUSION? Blood Transfusion Information  A transfusion is the replacement of blood or some of its parts. Blood is made up of multiple cells which provide different functions. Red blood cells carry oxygen and are used for blood loss replacement. White blood cells fight against infection. Platelets control bleeding. Plasma helps clot blood. Other blood products are available for specialized needs, such as hemophilia or other clotting disorders. BEFORE THE TRANSFUSION  Who gives blood for transfusions?  Healthy volunteers who are fully evaluated to make sure their blood is safe. This is blood bank blood. Transfusion therapy is the safest it has ever been in the practice of medicine. Before blood is taken from a donor, a complete history is taken to make sure that person has no history of diseases nor engages in risky social behavior (examples are intravenous drug use or sexual activity with multiple partners). The donor's travel history is screened to minimize risk of transmitting infections, such as malaria. The donated blood is tested for signs of infectious diseases, such as HIV and hepatitis. The blood is then tested to be sure it is compatible with you in order to minimize the chance of a transfusion reaction. If you or a relative donates blood, this is often done in anticipation of surgery and is not appropriate for emergency situations. It takes many days to process the donated blood. RISKS AND COMPLICATIONS Although transfusion therapy is very safe and saves many lives, the main dangers of transfusion include:  Getting an infectious disease. Developing a transfusion reaction. This is an allergic reaction to something in the blood you were given. Every precaution is taken to prevent this. The decision to have a blood transfusion has been considered carefully  by your caregiver before blood is given. Blood is not given unless the benefits outweigh the risks. AFTER THE TRANSFUSION Right after receiving a blood transfusion, you will usually feel much better and more energetic. This is especially true if your red blood cells have gotten low (anemic). The transfusion raises the level of the red blood cells which carry oxygen, and this usually causes an energy increase. The nurse administering the transfusion will monitor you carefully for complications. HOME CARE INSTRUCTIONS  No special instructions are needed after a transfusion. You may find your energy is better. Speak with your caregiver about any limitations on activity for underlying diseases you  may have. SEEK MEDICAL CARE IF:  Your condition is not improving after your transfusion. You develop redness or irritation at the intravenous (IV) site. SEEK IMMEDIATE MEDICAL CARE IF:  Any of the following symptoms occur over the next 12 hours: Shaking chills. You have a temperature by mouth above 102 F (38.9 C), not controlled by medicine. Chest, back, or muscle pain. People around you feel you are not acting correctly or are confused. Shortness of breath or difficulty breathing. Dizziness and fainting. You get a rash or develop hives. You have a decrease in urine output. Your urine turns a dark color or changes to pink, red, or brown. Any of the following symptoms occur over the next 10 days: You have a temperature by mouth above 102 F (38.9 C), not controlled by medicine. Shortness of breath. Weakness after normal activity. The white part of the eye turns yellow (jaundice). You have a decrease in the amount of urine or are urinating less often. Your urine turns a dark color or changes to pink, red, or brown. Document Released: 04/18/2000 Document Revised: 07/14/2011 Document Reviewed: 12/06/2007 [facility] Patient Information 2014 [place],  [place].  _______________________________________________________________________

## 2024-06-01 ENCOUNTER — Encounter (HOSPITAL_COMMUNITY): Payer: Self-pay

## 2024-06-01 ENCOUNTER — Other Ambulatory Visit: Payer: Self-pay

## 2024-06-01 ENCOUNTER — Encounter (HOSPITAL_COMMUNITY)
Admission: RE | Admit: 2024-06-01 | Discharge: 2024-06-01 | Disposition: A | Discharge status: Home / Self Care | Source: Ambulatory Visit | Attending: Urology | Admitting: Urology

## 2024-06-01 DIAGNOSIS — Z01818 Encounter for other preprocedural examination: Secondary | ICD-10-CM | POA: Insufficient documentation

## 2024-06-01 DIAGNOSIS — Z01812 Encounter for preprocedural laboratory examination: Secondary | ICD-10-CM | POA: Diagnosis present

## 2024-06-01 DIAGNOSIS — Z01810 Encounter for preprocedural cardiovascular examination: Secondary | ICD-10-CM | POA: Diagnosis present

## 2024-06-01 HISTORY — DX: Gastro-esophageal reflux disease without esophagitis: K21.9

## 2024-06-01 LAB — BASIC METABOLIC PANEL WITH GFR
Anion gap: 9 (ref 5–15)
BUN: 10 mg/dL (ref 6–20)
CO2: 24 mmol/L (ref 22–32)
Calcium: 9.4 mg/dL (ref 8.9–10.3)
Chloride: 106 mmol/L (ref 98–111)
Creatinine, Ser: 1.01 mg/dL (ref 0.61–1.24)
GFR, Estimated: >60 mL/min
Glucose, Bld: 107 mg/dL — ABNORMAL HIGH (ref 70–99)
Potassium: 4.6 mmol/L (ref 3.5–5.1)
Sodium: 138 mmol/L (ref 135–145)

## 2024-06-01 LAB — CBC
HCT: 45.9 % (ref 39.0–52.0)
Hemoglobin: 15.7 g/dL (ref 13.0–17.0)
MCH: 34.7 pg — ABNORMAL HIGH (ref 26.0–34.0)
MCHC: 34.2 g/dL (ref 30.0–36.0)
MCV: 101.3 fL — ABNORMAL HIGH (ref 80.0–100.0)
Platelets: 354 10*3/uL (ref 150–400)
RBC: 4.53 MIL/uL (ref 4.22–5.81)
RDW: 13.1 % (ref 11.5–15.5)
WBC: 7.2 10*3/uL (ref 4.0–10.5)
nRBC: 0 % (ref 0.0–0.2)

## 2024-06-02 ENCOUNTER — Encounter (HOSPITAL_COMMUNITY): Payer: Self-pay

## 2024-06-02 NOTE — Anesthesia Preprocedure Evaluation (Addendum)
"                                    Anesthesia Evaluation  Patient identified by MRN, date of birth, ID band Patient awake    Reviewed: Allergy & Precautions, H&P , NPO status , Patient's Chart, lab work & pertinent test results  Airway Mallampati: II  TM Distance: <3 FB Neck ROM: Full   Comment: Anterior ? Dental no notable dental hx. (+) Teeth Intact, Dental Advisory Given   Pulmonary neg pulmonary ROS, sleep apnea    Pulmonary exam normal breath sounds clear to auscultation       Cardiovascular Exercise Tolerance: Good negative cardio ROS Normal cardiovascular exam Rhythm:Regular Rate:Normal     Neuro/Psych negative neurological ROS  negative psych ROS   GI/Hepatic negative GI ROS, Neg liver ROS,GERD  ,,  Endo/Other  negative endocrine ROS    Renal/GU negative Renal ROS  negative genitourinary   Musculoskeletal negative musculoskeletal ROS (+)    Abdominal   Peds negative pediatric ROS (+)  Hematology negative hematology ROS (+)   Anesthesia Other Findings   Reproductive/Obstetrics negative OB ROS                              Anesthesia Physical Anesthesia Plan  ASA: 3  Anesthesia Plan: General   Post-op Pain Management: Minimal or no pain anticipated, Celebrex PO (pre-op)* and Tylenol  PO (pre-op)*   Induction: Intravenous  PONV Risk Score and Plan: 2 and Ondansetron  and Dexamethasone   Airway Management Planned: Oral ETT and Video Laryngoscope Planned  Additional Equipment: None  Intra-op Plan:   Post-operative Plan: Extubation in OR  Informed Consent: I have reviewed the patients History and Physical, chart, labs and discussed the procedure including the risks, benefits and alternatives for the proposed anesthesia with the patient or authorized representative who has indicated his/her understanding and acceptance.       Plan Discussed with: Anesthesiologist  Anesthesia Plan Comments: (PMH of OSA on  CPAP, GERD, prostate cancer. BP elevated in pre op. EKG sinus bradycardia. Advised monitor at home and f/u with PCP if remains elevated.)         Anesthesia Quick Evaluation  "

## 2024-06-07 NOTE — Progress Notes (Signed)
 RN spoke with patient prior to upcoming surgery.  No new needs at this time. Patient will proceed with surgery on 2/5 and will have post op follow up with Dr. Renda on 06/15/24.

## 2024-06-08 NOTE — H&P (Signed)
 " Visit Note - June 01, 2024 [place], Kotarski MRN: [id number] Phone: [phone] DOB: 11/27/1965 Sex: Male PMS ID: [id number] Clayton Smith (Primary Provider) (Bill Under) Page [phone] Work [phone] Fax [facility] [address] Christian[place] janifer [place], [place] [address] Social History Reviewed and no changes noted June 01, 2024. Single Question Alcohol Screening: 103 days EtOH none Smoking status - Never smoker Tobacco Use Does not use vaping products Does not use smokeless tobacco Medications Reviewed and no changes noted June 01, 2024. 24Hour Allergy oral Daily Probiotic oral Metamucil oral Mucinex oral Allergies Reviewed June 01, 2024. No known drug allergies Medical History Reviewed and no changes noted June 01, 2024. Surgical History Reviewed and no changes noted June 01, 2024. Urinary incontinence/sling operation Vasectomy Historical Summary: cc: elevated PSA 11/04/23: 59 yo man with elevated PSA 5.2 and 4.9. No family history of prostate cancer. He has had a history of BPH for many years. He has postvoid dribbling, weak urinary stream and nocturia every 2 hours. He also drinks a glass of water  when he wakes up at night. IPSS: 2, 4, 1, 0, 2, 0, 3= 12/35 2/6 mostly satisfied quality of life 03/09/2024: Here for TRUS prostate biopsy. PSA 5.2 and 4.9. MRI showed no targetable lesions and a 44.5 g prostate. 03/16/2024: Gleason 3+3= 6 in 5/12 cores Gleason 3+4=7 in 2/12 cores MSK 13 yr CSS 98 PFP 5 yr-80. 10 yr-67 OC 60 ECE 39 LN 5 SVI 5 06/01/2024: Patient is scheduled for robotic prostatectomy and bilateral pelvic lymph node dissection with Dr. Renda on 2/5. He is here today for preoperative appointment. Patient has already been evaluated preoperatively by our pelvic floor physical therapy department. He denies any changes in past medical history, no prescription medications taken on daily basis, no interval  surgical or procedural intervention. Continues to void at his baseline with grossly stable symptomology, denies any recent dysuria or gross hematuria. Denies any recent fever/chills, nausea/vomiting, chest pain or shortness of breath. PLAN: All questions answered to the best my ability regarding the upcoming procedure and expected postoperative course with understanding the patient. A urine culture was sent today to serve as a precautionary baseline. Moving forward he will proceed with previously scheduled robotic prostatectomy with Dr. Renda on 2/5. Exam: Exam Appearance: well developed and nourished, in no acute distress Neck Exam: neck is supple Respiratory Effort: normal respiratory effort without labored breathing  Skin Inspection: normal skin turgor Orientation: alert and oriented to person, place, time Mood: mood and affect well-adjusted, pleasant and cooperative, appropriate for clinical and encounter circumstances Data Reviewed:  2 Ordering of each unique test (Urinalysis (in-house) Automated, Urine Culture, Routine) Tests 1. Routine Tests Visit Note - June 01, 2024 Clayton, Smith MRN: [id number] Phone: [phone] DOB: 02-04-1966 Sex: Male PMS ID: [id number] Clayton Smith (Primary Provider) (Bill Under) Page [phone] Work [phone] Fax [facility] [address] [place] [address] [place], [place] [address] Routine Tests Test: Urinalysis (in-house) Automated. A urinalysis was performed. Results were reviewed and posted in the patient's chart. Test: Urinalysis, automated, without microscopy The Urinalysis procedure codes have been linked to ALL Impressions and ICD-10 codes for this visit. 2. Impression/Plan: Prostate Cancer (C61) Plan: Counseling for Prostate Cancer. I counseled the patient regarding the following:   I had a detailed discussion with the patient today regarding his prostate cancer diagnosis and options for treatment/management.  We discussed these options in the context of the patient's disease parameters as well  as his age and life expectancy. The patient was counseled about the natural history of prostate cancer and the standard treatment options that are available for prostate cancer. It was explained to him how his age and life expectancy, clinical stage, Gleason score/prognostic grade group, and PSA (and PSA density) affect his prognosis, the decision to proceed with additional staging studies, as well as how that information influences recommended treatment strategies. We discussed the roles for active surveillance, radiation therapy, surgical therapy, androgen deprivation, as well as ablative therapy and other investigational options for the treatment of prostate cancer as appropriate to his individual cancer situation. We discussed the risks and benefits of these options with regard to their impact on cancer control and also in terms of potential adverse events, complications, and impact on quality of life particularly related to urinary and sexual function. The patient was encouraged to ask questions throughout the discussion today and all questions were answered to his stated satisfaction. In addition, the patient was provided with and/or directed to appropriate resources and literature for further education about prostate cancer and treatment options.   We discussed surgical therapy for prostate cancer including the different available surgical approaches. We discussed, in detail, the risks and expectations of surgery with regard to cancer control, urinary control, and erectile function as well as the expected postoperative recovery process. Additional risks of surgery including but not limited to bleeding, infection, hernia formation, nerve damage, lymphocele formation, bowel/rectal injury potentially necessitating colostomy, damage to the urinary tract resulting in urine leakage, urethral stricture, and the  cardiopulmonary risks such as myocardial infarction, stroke, death, venothromboembolism, etc. were explained. The risk of open surgical conversion for robotic/laparoscopic prostatectomy was also discussed. After counseling, we decided on the following plan: Prostatectomy Plan: Visit complexity. Medical care services associated with ongoing care, beyond the standard evaluation and management Indication: Provided medical care services as part of ongoing care related to the patient's single, serious or complex chronic condition. 1. Male Stress Urinary Incontinence (N39.3) Associated diagnoses: Other specified disorders of muscle and Muscle weakness (generalized) Plan: Order Tests. Labs:  991152 - Urine Culture, Routine Associated with: Urinalysis (in-house) Automated 2. Urinalysis/Microscopy 06/01/2024 10:48 AM - Recorded By Philippe Mayer Urinalysis: Test Description Results Abnormal Reference Range Units Specimen Voided Color Yellow Pale, Straw, Yellow Appearance Clear Clear Specific Gravity 1.010 1.005 - 1.030 pH 6.5 5.0 - 8.0 Visit Note - June 01, 2024 Maven, Varelas MRN: [id number] Phone: [phone] DOB: 1965/07/16 Sex: Male PMS ID: [id number] Clayton Smith (Primary Provider) Cherylin Under) Page 3 [phone] Work [phone] Fax [facility] [address] [place] [address] [place], [place] [address] Glucose Negative NEG mg/dL Bilirubin Negative NEG Ketone Negative NEG mg/dL Blood Negative NEG Protein Negative NEG mg/dL Urobilinogen Trace 0.2 - 1 mg/dL Nitrite Negative NEG Leukocytes Negative NEG Microscopy: No results recorded Follow up. Other Instructions: Keep previously scheduled surgery date with Dr. Renda Staff: Clayton Smith (Primary Provider) Cherylin Under) Electronically Signed By: Clayton Smith, 06/01/2024 12:23 PM EST "

## 2024-06-09 ENCOUNTER — Encounter (HOSPITAL_COMMUNITY): Payer: Self-pay | Admitting: Anesthesiology

## 2024-06-09 ENCOUNTER — Other Ambulatory Visit: Payer: Self-pay

## 2024-06-09 ENCOUNTER — Ambulatory Visit (HOSPITAL_COMMUNITY): Payer: Self-pay | Admitting: Medical

## 2024-06-09 ENCOUNTER — Encounter (HOSPITAL_COMMUNITY): Admission: RE | Disposition: A | Payer: Self-pay | Source: Ambulatory Visit | Attending: Urology

## 2024-06-09 ENCOUNTER — Encounter (HOSPITAL_COMMUNITY): Payer: Self-pay | Admitting: Urology

## 2024-06-09 ENCOUNTER — Observation Stay (HOSPITAL_COMMUNITY)
Admission: RE | Admit: 2024-06-09 | Discharge: 2024-06-10 | Disposition: A | Source: Ambulatory Visit | Attending: Urology | Admitting: Urology

## 2024-06-09 DIAGNOSIS — C61 Malignant neoplasm of prostate: Principal | ICD-10-CM | POA: Diagnosis present

## 2024-06-09 LAB — TYPE AND SCREEN
ABO/RH(D): A NEG
Antibody Screen: NEGATIVE

## 2024-06-09 LAB — HEMOGLOBIN AND HEMATOCRIT, BLOOD
HCT: 45.2 % (ref 39.0–52.0)
Hemoglobin: 15.1 g/dL (ref 13.0–17.0)

## 2024-06-09 LAB — ABO/RH: ABO/RH(D): A NEG

## 2024-06-09 MED ORDER — ZOLPIDEM TARTRATE 5 MG PO TABS: 5.0000 mg | ORAL_TABLET | Freq: Every evening | ORAL | Status: DC | PRN

## 2024-06-09 MED ORDER — DEXAMETHASONE SOD PHOSPHATE PF 10 MG/ML IJ SOLN
INTRAMUSCULAR | Status: DC | PRN
  Administered 2024-06-09: 5 mg via INTRAVENOUS

## 2024-06-09 MED ORDER — ONDANSETRON HCL 4 MG/2ML IJ SOLN
INTRAMUSCULAR | Status: AC
  Filled 2024-06-09: qty 2

## 2024-06-09 MED ORDER — FENTANYL CITRATE (PF) 100 MCG/2ML IJ SOLN
INTRAMUSCULAR | Status: DC | PRN
  Administered 2024-06-09 (×2): 50 ug via INTRAVENOUS
  Administered 2024-06-09 (×2): 25 ug via INTRAVENOUS
  Administered 2024-06-09: 50 ug via INTRAVENOUS

## 2024-06-09 MED ORDER — SULFAMETHOXAZOLE-TRIMETHOPRIM 800-160 MG PO TABS: 1.0000 | ORAL_TABLET | Freq: Two times a day (BID) | ORAL | 0 refills | Status: AC

## 2024-06-09 MED ORDER — LIDOCAINE HCL (PF) 2 % IJ SOLN
INTRAMUSCULAR | Status: DC | PRN
  Administered 2024-06-09: 60 mg via INTRADERMAL

## 2024-06-09 MED ORDER — HYDROMORPHONE HCL 1 MG/ML IJ SOLN
INTRAMUSCULAR | Status: DC | PRN
  Administered 2024-06-09 (×2): 1 mg via INTRAVENOUS

## 2024-06-09 MED ORDER — BELLADONNA ALKALOIDS-OPIUM 16.2-60 MG RE SUPP: 1.0000 | Freq: Three times a day (TID) | RECTAL | Status: DC | PRN

## 2024-06-09 MED ORDER — ORAL CARE MOUTH RINSE: 15.0000 mL | Freq: Once | OROMUCOSAL | Status: AC

## 2024-06-09 MED ORDER — MENTHOL 3 MG MT LOZG
1.0000 | LOZENGE | OROMUCOSAL | Status: DC | PRN
  Administered 2024-06-09: 3 mg via ORAL
  Filled 2024-06-09: qty 9

## 2024-06-09 MED ORDER — ONDANSETRON HCL 4 MG/2ML IJ SOLN
4.0000 mg | INTRAMUSCULAR | Status: DC | PRN
  Administered 2024-06-10: 4 mg via INTRAVENOUS
  Filled 2024-06-09: qty 2

## 2024-06-09 MED ORDER — FENTANYL CITRATE (PF) 50 MCG/ML IJ SOSY
PREFILLED_SYRINGE | INTRAMUSCULAR | Status: AC
  Filled 2024-06-09: qty 3

## 2024-06-09 MED ORDER — ACETAMINOPHEN 325 MG PO TABS: 650.0000 mg | ORAL_TABLET | ORAL | Status: DC | PRN

## 2024-06-09 MED ORDER — LIDOCAINE HCL (PF) 2 % IJ SOLN
INTRAMUSCULAR | Status: AC
  Filled 2024-06-09: qty 5

## 2024-06-09 MED ORDER — ORAL CARE MOUTH RINSE: 15.0000 mL | OROMUCOSAL | Status: DC | PRN

## 2024-06-09 MED ORDER — DEXMEDETOMIDINE HCL IN NACL 80 MCG/20ML IV SOLN
INTRAVENOUS | Status: DC | PRN
  Administered 2024-06-09: 4 ug via INTRAVENOUS
  Administered 2024-06-09 (×2): 8 ug via INTRAVENOUS

## 2024-06-09 MED ORDER — MORPHINE SULFATE (PF) 2 MG/ML IV SOLN: 2.0000 mg | INTRAVENOUS | Status: DC | PRN

## 2024-06-09 MED ORDER — FENTANYL CITRATE (PF) 50 MCG/ML IJ SOSY
25.0000 ug | PREFILLED_SYRINGE | INTRAMUSCULAR | Status: DC | PRN
  Administered 2024-06-09 (×3): 50 ug via INTRAVENOUS

## 2024-06-09 MED ORDER — DOCUSATE SODIUM 100 MG PO CAPS: 100.0000 mg | ORAL_CAPSULE | Freq: Two times a day (BID) | ORAL | Status: AC

## 2024-06-09 MED ORDER — FENTANYL CITRATE (PF) 100 MCG/2ML IJ SOLN
INTRAMUSCULAR | Status: AC
  Filled 2024-06-09: qty 2

## 2024-06-09 MED ORDER — TRIPLE ANTIBIOTIC 3.5-400-5000 EX OINT: 1.0000 | TOPICAL_OINTMENT | Freq: Three times a day (TID) | CUTANEOUS | Status: DC | PRN

## 2024-06-09 MED ORDER — MEPERIDINE HCL 25 MG/ML IJ SOLN: 6.2500 mg | INTRAMUSCULAR | Status: DC | PRN

## 2024-06-09 MED ORDER — KETOROLAC TROMETHAMINE 15 MG/ML IJ SOLN
INTRAMUSCULAR | Status: AC
  Filled 2024-06-09: qty 1

## 2024-06-09 MED ORDER — MAGNESIUM CITRATE PO SOLN: 1.0000 | Freq: Once | ORAL | Status: DC

## 2024-06-09 MED ORDER — FAMOTIDINE-CA CARB-MAG HYDROX 10-800-165 MG PO CHEW: 1.0000 | CHEWABLE_TABLET | Freq: Every day | ORAL | Status: DC

## 2024-06-09 MED ORDER — KETAMINE HCL 10 MG/ML IJ SOLN
INTRAMUSCULAR | Status: DC | PRN
  Administered 2024-06-09: 20 mg via INTRAVENOUS

## 2024-06-09 MED ORDER — LORATADINE 10 MG PO TABS
10.0000 mg | ORAL_TABLET | Freq: Every day | ORAL | Status: DC
  Administered 2024-06-09 – 2024-06-10 (×2): 10 mg via ORAL
  Filled 2024-06-09 (×2): qty 1

## 2024-06-09 MED ORDER — DIPHENHYDRAMINE HCL 12.5 MG/5ML PO ELIX: 12.5000 mg | ORAL_SOLUTION | Freq: Four times a day (QID) | ORAL | Status: DC | PRN

## 2024-06-09 MED ORDER — HEPARIN SODIUM (PORCINE) 1000 UNIT/ML IJ SOLN
INTRAMUSCULAR | Status: AC
  Filled 2024-06-09: qty 1

## 2024-06-09 MED ORDER — KETOROLAC TROMETHAMINE 15 MG/ML IJ SOLN
15.0000 mg | Freq: Four times a day (QID) | INTRAMUSCULAR | Status: DC
  Administered 2024-06-09 – 2024-06-10 (×5): 15 mg via INTRAVENOUS
  Filled 2024-06-09 (×4): qty 1

## 2024-06-09 MED ORDER — CEFAZOLIN SODIUM-DEXTROSE 2-4 GM/100ML-% IV SOLN
2.0000 g | INTRAVENOUS | Status: AC
  Administered 2024-06-09: 2 g via INTRAVENOUS
  Filled 2024-06-09: qty 100

## 2024-06-09 MED ORDER — SUGAMMADEX SODIUM 200 MG/2ML IV SOLN
INTRAVENOUS | Status: DC | PRN
  Administered 2024-06-09: 200 mg via INTRAVENOUS

## 2024-06-09 MED ORDER — GLYCOPYRROLATE 0.2 MG/ML IJ SOLN
INTRAMUSCULAR | Status: DC | PRN
  Administered 2024-06-09: .2 mg via INTRAVENOUS

## 2024-06-09 MED ORDER — BUPIVACAINE-EPINEPHRINE 0.25% -1:200000 IJ SOLN
INTRAMUSCULAR | Status: DC | PRN
  Administered 2024-06-09: 10 mL

## 2024-06-09 MED ORDER — ONDANSETRON HCL 4 MG/2ML IJ SOLN: 4.0000 mg | Freq: Once | INTRAMUSCULAR | Status: DC | PRN

## 2024-06-09 MED ORDER — DOCUSATE SODIUM 100 MG PO CAPS
100.0000 mg | ORAL_CAPSULE | Freq: Two times a day (BID) | ORAL | Status: DC
  Administered 2024-06-09 – 2024-06-10 (×2): 100 mg via ORAL
  Filled 2024-06-09 (×2): qty 1

## 2024-06-09 MED ORDER — CHLORHEXIDINE GLUCONATE 0.12 % MT SOLN
15.0000 mL | Freq: Once | OROMUCOSAL | Status: AC
  Administered 2024-06-09: 15 mL via OROMUCOSAL

## 2024-06-09 MED ORDER — OXYCODONE HCL 5 MG PO TABS
5.0000 mg | ORAL_TABLET | Freq: Once | ORAL | Status: AC | PRN
  Administered 2024-06-09: 5 mg via ORAL

## 2024-06-09 MED ORDER — PROPOFOL 10 MG/ML IV BOLUS
INTRAVENOUS | Status: DC | PRN
  Administered 2024-06-09: 200 mg via INTRAVENOUS

## 2024-06-09 MED ORDER — TRAMADOL HCL 50 MG PO TABS: 50.0000 mg | ORAL_TABLET | Freq: Four times a day (QID) | ORAL | 0 refills | Status: AC | PRN

## 2024-06-09 MED ORDER — ONDANSETRON HCL 4 MG/2ML IJ SOLN
INTRAMUSCULAR | Status: DC | PRN
  Administered 2024-06-09: 4 mg via INTRAVENOUS

## 2024-06-09 MED ORDER — MIDAZOLAM HCL 2 MG/2ML IJ SOLN
INTRAMUSCULAR | Status: AC
  Filled 2024-06-09: qty 2

## 2024-06-09 MED ORDER — OXYCODONE HCL 5 MG PO TABS
ORAL_TABLET | ORAL | Status: AC
  Filled 2024-06-09: qty 1

## 2024-06-09 MED ORDER — LACTATED RINGERS IV SOLN: INTRAVENOUS | Status: DC

## 2024-06-09 MED ORDER — DIPHENHYDRAMINE HCL 50 MG/ML IJ SOLN: 12.5000 mg | Freq: Four times a day (QID) | INTRAMUSCULAR | Status: DC | PRN

## 2024-06-09 MED ORDER — ROCURONIUM BROMIDE 10 MG/ML (PF) SYRINGE
PREFILLED_SYRINGE | INTRAVENOUS | Status: AC
  Filled 2024-06-09: qty 10

## 2024-06-09 MED ORDER — OXYCODONE HCL 5 MG/5ML PO SOLN: 5.0000 mg | Freq: Once | ORAL | Status: AC | PRN

## 2024-06-09 MED ORDER — MIDAZOLAM HCL 5 MG/5ML IJ SOLN
INTRAMUSCULAR | Status: DC | PRN
  Administered 2024-06-09 (×2): 1 mg via INTRAVENOUS

## 2024-06-09 MED ORDER — INFLUENZA VIRUS VACC SPLIT PF (FLUZONE) 0.5 ML IM SUSY
0.5000 mL | PREFILLED_SYRINGE | INTRAMUSCULAR | Status: AC
  Administered 2024-06-10: 0.5 mL via INTRAMUSCULAR

## 2024-06-09 MED ORDER — PROPOFOL 10 MG/ML IV BOLUS
INTRAVENOUS | Status: AC
  Filled 2024-06-09: qty 20

## 2024-06-09 MED ORDER — FAMOTIDINE 20 MG PO TABS
20.0000 mg | ORAL_TABLET | Freq: Every day | ORAL | Status: DC
  Administered 2024-06-09 – 2024-06-10 (×2): 20 mg via ORAL
  Filled 2024-06-09 (×2): qty 1

## 2024-06-09 MED ORDER — SODIUM CHLORIDE 0.9 % IV BOLUS: 1000.0000 mL | Freq: Once | INTRAVENOUS | Status: DC

## 2024-06-09 MED ORDER — DEXAMETHASONE SOD PHOSPHATE PF 10 MG/ML IJ SOLN
INTRAMUSCULAR | Status: AC
  Filled 2024-06-09: qty 1

## 2024-06-09 MED ORDER — ROCURONIUM BROMIDE 10 MG/ML (PF) SYRINGE
PREFILLED_SYRINGE | INTRAVENOUS | Status: DC | PRN
  Administered 2024-06-09: 10 mg via INTRAVENOUS
  Administered 2024-06-09: 30 mg via INTRAVENOUS
  Administered 2024-06-09: 10 mg via INTRAVENOUS
  Administered 2024-06-09: 70 mg via INTRAVENOUS
  Administered 2024-06-09: 10 mg via INTRAVENOUS

## 2024-06-09 MED ORDER — BUPIVACAINE-EPINEPHRINE (PF) 0.25% -1:200000 IJ SOLN
INTRAMUSCULAR | Status: AC
  Filled 2024-06-09: qty 30

## 2024-06-09 MED ORDER — HEMOSTATIC AGENTS (NO CHARGE) OPTIME
TOPICAL | Status: DC | PRN
  Administered 2024-06-09: 1 via TOPICAL

## 2024-06-09 MED ORDER — LACTATED RINGERS IR SOLN
Status: DC | PRN
  Administered 2024-06-09: 1000 mL

## 2024-06-09 MED ORDER — FLEET ENEMA RE ENEM: 1.0000 | ENEMA | Freq: Once | RECTAL | Status: DC

## 2024-06-09 MED ORDER — KCL IN DEXTROSE-NACL 20-5-0.45 MEQ/L-%-% IV SOLN
INTRAVENOUS | Status: DC
  Filled 2024-06-09 (×3): qty 1000

## 2024-06-09 MED ORDER — CEFAZOLIN SODIUM-DEXTROSE 1-4 GM/50ML-% IV SOLN
1.0000 g | Freq: Three times a day (TID) | INTRAVENOUS | Status: AC
  Administered 2024-06-09 – 2024-06-10 (×2): 1 g via INTRAVENOUS
  Filled 2024-06-09 (×3): qty 50

## 2024-06-09 MED ORDER — STERILE WATER FOR IRRIGATION IR SOLN
Status: DC | PRN
  Administered 2024-06-09: 1000 mL

## 2024-06-09 MED ORDER — LACTATED RINGERS IV SOLN
INTRAVENOUS | Status: DC | PRN
  Administered 2024-06-09: 1000 mL

## 2024-06-09 NOTE — Anesthesia Procedure Notes (Signed)
 Procedure Name: Intubation Date/Time: 06/09/2024 12:00 PM  Performed by: Kathern Rollene LABOR, CRNAPre-anesthesia Checklist: Patient identified, Emergency Drugs available, Suction available and Patient being monitored Patient Re-evaluated:Patient Re-evaluated prior to induction Oxygen Delivery Method: Circle system utilized Preoxygenation: Pre-oxygenation with 100% oxygen Induction Type: IV induction Ventilation: Mask ventilation without difficulty Laryngoscope Size: Glidescope and 3 Grade View: Grade I Tube type: Oral Tube size: 7.5 mm Number of attempts: 1 Airway Equipment and Method: Video-laryngoscopy and Rigid stylet Placement Confirmation: ETT inserted through vocal cords under direct vision, positive ETCO2 and breath sounds checked- equal and bilateral Secured at: 22 cm Tube secured with: Tape Dental Injury: Teeth and Oropharynx as per pre-operative assessment  Difficulty Due To: Difficulty was anticipated, Difficult Airway- due to limited oral opening and Difficult Airway- due to anterior larynx

## 2024-06-09 NOTE — Op Note (Signed)
 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 M.D.  Assistant: Alan Hammonds, PA-C  An assistant was required for this surgical procedure.  The duties of the assistant included but were not limited to suctioning, passing suture, camera manipulation, retraction. This procedure would not be able to be performed without an geophysicist/field seismologist.  Anesthesia: General  Complications: None  EBL: 50 mL  IVF:  1500 mL crystalloid  Specimens: Prostate and seminal vesicles Right pelvic lymph nodes Left pelvic lymph nodes  Disposition of specimens: Pathology  Drains: 20 Fr coude catheter # 19 Blake pelvic drain  Indication: Clayton Smith is a 59 y.o. year old patient with clinically localized prostate cancer.  After a thorough review of the management options for treatment of prostate cancer, he elected to proceed with surgical therapy and the above procedure(s).  We have discussed the potential benefits and risks of the procedure, side effects of the proposed treatment, the likelihood of the patient achieving the goals of the procedure, and any potential problems that might occur during the procedure or recuperation. Informed consent has been obtained.  Description of procedure:  The patient was taken to the operating room and a general anesthetic was administered. He was given preoperative antibiotics, placed in the dorsal lithotomy position, and prepped and draped in the usual sterile fashion. Next a preoperative timeout was performed. A urethral catheter was placed into the bladder and a site was selected near the umbilicus for placement of the camera  port. This was placed using a standard open Hassan technique which allowed entry into the peritoneal cavity under direct vision and without difficulty. An 8 mm robotic port was placed and a pneumoperitoneum established. The camera was then used to inspect the abdomen and there was no evidence of any intra-abdominal injuries or other abnormalities. The remaining abdominal ports were then placed. 8 mm robotic ports were placed in the right lower quadrant, left lower quadrant, and far left lateral abdominal wall. A 5 mm port was placed in the right upper quadrant and a 12 mm port was placed in the right lateral abdominal wall for laparoscopic assistance. All ports were placed under direct vision without difficulty. The surgical cart was then docked.   Utilizing the cautery scissors, the bladder was reflected posteriorly allowing entry into the space of Retzius and identification of the endopelvic fascia and prostate. The periprostatic fat was then removed from the prostate allowing full exposure of the endopelvic fascia. The endopelvic fascia was then incised from the apex back to the base of the prostate bilaterally and the underlying levator muscle fibers were swept laterally off the prostate thereby isolating the dorsal venous complex. The dorsal vein was then stapled and divided with a 45 mm Flex Echelon stapler. Attention then turned to the bladder neck which was divided anteriorly thereby allowing entry into the bladder and exposure of the urethral catheter. The catheter balloon was deflated and the catheter was brought into the operative field and used to retract the prostate anteriorly. The posterior bladder neck was then examined and was divided allowing further dissection between the bladder and prostate posteriorly until the vasa deferentia and seminal vessels were identified. The vasa deferentia were isolated, divided, and lifted anteriorly. The seminal vesicles were dissected down to  their tips with care  to control the seminal vascular arterial blood supply. These structures were then lifted anteriorly and the space between Denonvilliers fascia and the anterior rectum was developed with a combination of sharp and blunt dissection. This isolated the vascular pedicles of the prostate.  The lateral prostatic fascia was then sharply incised allowing release of the neurovascular bundles bilaterally. The vascular pedicles of the prostate were then ligated with Weck clips between the prostate and neurovascular bundles and divided with sharp cold scissor dissection resulting in neurovascular bundle preservation. The neurovascular bundles were then separated off the apex of the prostate and urethra bilaterally.  The urethra was then sharply transected allowing the prostate specimen to be disarticulated. The pelvis was copiously irrigated and hemostasis was ensured. There was no evidence for rectal injury.  Attention then turned to the right pelvic sidewall. The fibrofatty tissue between the external iliac vein, confluence of the iliac vessels, hypogastric artery, and Cooper's ligament was dissected free from the pelvic sidewall with care to preserve the obturator nerve. Weck clips were used for lymphostasis and hemostasis. An identical procedure was performed on the contralateral side and the lymphatic packets were removed for permanent pathologic analysis.  Attention then turned to the urethral anastomosis. A 2-0 Vicryl slip knot was placed between Denonvilliers fascia, the posterior bladder neck, and the posterior urethra to reapproximate these structures. A double-armed 3-0 Monocryl suture was then used to perform a 360 running tension-free anastomosis between the bladder neck and urethra. A new urethral catheter was then placed into the bladder and irrigated. There were no blood clots within the bladder and the anastomosis appeared to be watertight. A #19 Blake drain was then brought through the left lateral 8  mm port site and positioned appropriately within the pelvis. It was secured to the skin with a nylon suture. The surgical cart was then undocked. The right lateral 12 mm port site was closed at the fascial level with a 0 Vicryl suture placed laparoscopically. All remaining ports were then removed under direct vision. The prostate specimen was removed intact within the Endopouch retrieval bag via the periumbilical camera port site. This fascial opening was closed with two running 0 PDS sutures. 0.25% Marcaine  was then injected into all port sites and all incisions were reapproximated at the skin level with 4-0 Monocryl subcuticular sutures and Dermabond. The patient appeared to tolerate the procedure well and without complications. The patient was able to be extubated and transferred to the recovery unit in satisfactory condition.   Gretel CANDIE Renda Teddie MD

## 2024-06-09 NOTE — Progress Notes (Signed)
" °   06/09/24 2315  BiPAP/CPAP/SIPAP  $ Non-Invasive Home Ventilator  Initial  BiPAP/CPAP/SIPAP Pt Type Adult  BiPAP/CPAP/SIPAP Resmed  Mask Type Nasal pillows  Dentures removed? Not applicable  FiO2 (%) 21 %  Patient Home Machine No  Patient Home Mask Yes  Patient Home Tubing No (pt home tubing will not fit)  Auto Titrate Yes  Minimum cmH2O 5 cmH2O  Maximum cmH2O 15 cmH2O  CPAP/SIPAP surface wiped down Yes  Device Plugged into RED Power Outlet Yes  BiPAP/CPAP /SiPAP Vitals  Bilateral Breath Sounds Diminished    "

## 2024-06-09 NOTE — Progress Notes (Signed)
 Patient ID: Clayton Smith, male   DOB: 10-27-1965, 59 y.o.   MRN: [id number]  Post-op note  Subjective: The patient is doing well.  No complaints.  Objective: Vital signs in last 24 hours: Temp:  [96.7 F (35.9 C)-98 F (36.7 C)] 97.8 F (36.6 C) (02/05 1537) Pulse Rate:  [44-58] 57 (02/05 1537) Resp:  [12-24] 24 (02/05 1522) BP: (125-142)/(74-89) 131/86 (02/05 1537) SpO2:  [87 %-100 %] 97 % (02/05 1537) Weight:  [86.2 kg] 86.2 kg (02/05 1544)  Intake/Output from previous day: No intake/output data recorded. Intake/Output this shift: Total I/O In: 2600 [I.V.:2500; IV Piggyback:100] Out: 70 [Drains:20; Blood:50]  Physical Exam:  General: Alert and oriented. Abdomen: Soft, Nondistended. Incisions: Clean and dry. GU: Urine clear.  Lab Results: Recent Labs    06/09/24 1440  HGB 15.1  HCT 45.2    Assessment/Plan: POD#0   1) Continue to monitor, ambulate, IS   Clayton Smith. MD   LOS: 0 days   Clayton Smith 06/09/2024, 5:55 PM

## 2024-06-09 NOTE — Interval H&P Note (Signed)
 History and Physical Interval Note:  06/09/2024 10:48 AM  Clayton Smith  has presented today for surgery, with the diagnosis of PROSTATE CANCER.  The various methods of treatment have been discussed with the patient and family. After consideration of risks, benefits and other options for treatment, the patient has consented to  Procedures with comments: ROBOTIC ASSISTED LAPAROSCOPIC RADICAL PROSTATECTOMY (N/A) - LEVEL 2 LYMPHADENECTOMY, PELVIS, ROBOT-ASSISTED (Bilateral) as a surgical intervention.  The patient's history has been reviewed, patient examined, no change in status, stable for surgery.  I have reviewed the patient's chart and labs.  Questions were answered to the patient's satisfaction.     [employer]

## 2024-06-09 NOTE — Discharge Instructions (Signed)
Activity:  You are encouraged to ambulate frequently (about every hour during waking hours) to help prevent blood clots from forming in your legs or lungs.  However, you should not engage in any heavy lifting (> 10-15 lbs), strenuous activity, or straining. Diet: You should continue a clear liquid diet until passing gas from below.  Once this occurs, you may advance your diet to a soft diet that would be easy to digest (i.e soups, scrambled eggs, mashed potatoes, etc.) for 24 hours just as you would if getting over a bad stomach flu.  If tolerating this diet well for 24 hours, you may then begin eating regular food.  It will be normal to have some amount of bloating, nausea, and abdominal discomfort intermittently. Prescriptions:  You will be provided a prescription for pain medication to take as needed.  If your pain is not severe enough to require the prescription pain medication, you may take Tylenol instead.  You should also take an over the counter stool softener (Colace 100 mg twice daily) to avoid straining with bowel movements as the pain medication may constipate you. Finally, you will also be provided a prescription for an antibiotic to begin the day prior to your return visit in the office for catheter removal. Catheter care: You will be taught how to take care of the catheter by the nursing staff prior to discharge from the hospital.  You may use both a leg bag and the larger bedside bag but it is recommended to at least use the bigger bedside bag at nighttime as the leg bag is small and will fill up overnight and also does not drain as well when lying flat. You may periodically feel a strong urge to void with the catheter in place.  This is a bladder spasm and most often can occur when having a bowel movement or when you are moving around. It is typically self-limited and usually will stop after a few minutes.  You may use some Vaseline or Neosporin around the tip of the catheter to reduce friction  at the tip of the penis. Incisions: You may remove your dressing bandages the 2nd day after surgery.  You most likely will have a few small staples in each of the incisions and once the bandages are removed, the incisions may stay open to air.  You may start showering (not soaking or bathing in water) 48 hours after surgery and the incisions simply need to be patted dry after the shower.  No additional care is needed. What to call us about: You should call the office ([phone]) if you develop fever > 101, persistent vomiting, or the catheter stops draining. Also, feel free to call with any other questions you may have and remember the handout that was provided to you as a reference preoperatively which answers many of the common questions that arise after surgery. You may resume Mobic, aspirin, advil, aleve, vitamins, and supplements 7 days after surgery.  

## 2024-06-09 NOTE — Anesthesia Postprocedure Evaluation (Signed)
"   Anesthesia Post Note  Patient: Clayton Smith  Procedure(s) Performed: ROBOTIC ASSISTED LAPAROSCOPIC RADICAL PROSTATECTOMY (Abdomen) LYMPHADENECTOMY, PELVIS, ROBOT-ASSISTED (Bilateral)     Patient location during evaluation: PACU Anesthesia Type: General Level of consciousness: awake and alert Pain management: pain level controlled Vital Signs Assessment: post-procedure vital signs reviewed and stable Respiratory status: spontaneous breathing, nonlabored ventilation, respiratory function stable and patient connected to nasal cannula oxygen Cardiovascular status: blood pressure returned to baseline and stable Postop Assessment: no apparent nausea or vomiting Anesthetic complications: no   No notable events documented.  Last Vitals:  Vitals:   06/09/24 1522 06/09/24 1537  BP:  131/86  Pulse: (!) 57 (!) 57  Resp: (!) 24   Temp: (!) 36.1 C 36.6 C  SpO2: 100% 97%    Last Pain:  Vitals:   06/09/24 1537  TempSrc: Oral  PainSc:                  Kimiya Brunelle      "

## 2024-06-09 NOTE — Transfer of Care (Signed)
 Immediate Anesthesia Transfer of Care Note  Patient: Clayton Smith  Procedure(s) Performed: ROBOTIC ASSISTED LAPAROSCOPIC RADICAL PROSTATECTOMY (Abdomen) LYMPHADENECTOMY, PELVIS, ROBOT-ASSISTED (Bilateral)  Patient Location: PACU  Anesthesia Type:General  Level of Consciousness: awake, alert , oriented, and patient cooperative  Airway & Oxygen Therapy: Patient Spontanous Breathing and Patient connected to face mask oxygen  Post-op Assessment: Report given to RN and Post -op Vital signs reviewed and stable  Post vital signs: Reviewed and stable  Last Vitals:  Vitals Value Taken Time  BP 134/74 06/09/24 14:20  Temp    Pulse 48 06/09/24 14:24  Resp 16 06/09/24 14:24  SpO2 99 % 06/09/24 14:24  Vitals shown include unfiled device data.  Last Pain:  Vitals:   06/09/24 0938  PainSc: 2          Complications: No notable events documented.

## 2024-06-10 ENCOUNTER — Encounter (HOSPITAL_COMMUNITY): Payer: Self-pay | Admitting: Urology

## 2024-06-10 LAB — HEMOGLOBIN AND HEMATOCRIT, BLOOD
HCT: 36.9 % — ABNORMAL LOW (ref 39.0–52.0)
HCT: 34.8 % — ABNORMAL LOW (ref 39.0–52.0)
Hemoglobin: 12.4 g/dL — ABNORMAL LOW (ref 13.0–17.0)
Hemoglobin: 12 g/dL — ABNORMAL LOW (ref 13.0–17.0)

## 2024-06-10 MED ORDER — BISACODYL 10 MG RE SUPP
10.0000 mg | Freq: Once | RECTAL | Status: AC
  Administered 2024-06-10: 10 mg via RECTAL
  Filled 2024-06-10: qty 1

## 2024-06-10 MED ORDER — CHLORHEXIDINE GLUCONATE CLOTH 2 % EX PADS
6.0000 | MEDICATED_PAD | Freq: Every day | CUTANEOUS | Status: DC
  Administered 2024-06-10: 6 via TOPICAL

## 2024-06-10 MED ORDER — TRAMADOL HCL 50 MG PO TABS: 50.0000 mg | ORAL_TABLET | Freq: Four times a day (QID) | ORAL | Status: DC | PRN

## 2024-06-10 NOTE — Progress Notes (Signed)
 Patient ID: Clayton Smith, male   DOB: 10/08/1965, 59 y.o.   MRN: [id number]  1 Day Post-Op Subjective: The patient is doing well.  No nausea or vomiting. Pain is adequately controlled.  Objective: Vital signs in last 24 hours: Temp:  [96.7 F (35.9 C)-98 F (36.7 C)] 97.4 F (36.3 C) (02/06 0500) Pulse Rate:  [44-64] 64 (02/06 0653) Resp:  [12-24] 19 (02/06 0500) BP: (125-142)/(74-89) 129/86 (02/06 0500) SpO2:  [87 %-100 %] 99 % (02/06 0500) FiO2 (%):  [21 %] 21 % (02/05 2315) Weight:  [86.2 kg] 86.2 kg (02/05 1544)  Intake/Output from previous day: 02/05 0701 - 02/06 0700 In: 7595 [P.O.:3510; I.V.:3935; IV Piggyback:150] Out: 3000 [Urine:2900; Drains:50; Blood:50] Intake/Output this shift: No intake/output data recorded.  Physical Exam:  General: Alert and oriented. CV: RRR Lungs: Clear bilaterally. GI: Soft, Nondistended. Incisions: Clean, dry, and intact Urine: Clear Extremities: Nontender, no erythema, no edema.  Lab Results: Recent Labs    06/09/24 1440 06/10/24 0608  HGB 15.1 12.4*  HCT 45.2 36.9*      Assessment/Plan: POD# 1 s/p robotic prostatectomy.  1) SL IVF 2) Ambulate, Incentive spirometry 3) Transition to oral pain medication 4) Dulcolax suppository 5) D/C pelvic drain 6) Recheck H/H later this morning 7) Plan for likely discharge later today   Clayton Smith. MD   LOS: 0 days   Clayton Smith 06/10/2024, 7:31 AM

## 2024-06-10 NOTE — Discharge Summary (Signed)
 Date of admission: 06/09/2024  Date of discharge: 06/10/2024  Admission diagnosis: Prostate Cancer  Discharge diagnosis: Prostate Cancer  History and Physical: For full details, please see admission history and physical. Briefly, Clayton Smith is a 59 y.o. gentleman with localized prostate cancer.  After discussing management/treatment options, he elected to proceed with surgical treatment.  Hospital Course: Clayton Smith was taken to the operating room on 06/09/2024 and underwent a robotic assisted laparoscopic radical prostatectomy. He tolerated this procedure well and without complications. Postoperatively, he was able to be transferred to a regular hospital room following recovery from anesthesia.  He was able to begin ambulating the night of surgery. He remained hemodynamically stable overnight.  He had excellent urine output with appropriately minimal output from his pelvic drain and his pelvic drain was removed on POD #1.  He was transitioned to oral pain medication, tolerated a clear liquid diet, and had met all discharge criteria and was able to be discharged home later on POD#1.  Laboratory values:  Recent Labs    06/09/24 1440 06/10/24 0608 06/10/24 1048  HGB 15.1 12.4* 12.0*  HCT 45.2 36.9* 34.8*    Disposition: Home  Discharge instruction: He was instructed to be ambulatory but to refrain from heavy lifting, strenuous activity, or driving. He was instructed on urethral catheter care.  Discharge medications:   Allergies as of 06/10/2024   No Known Allergies      Medication List     STOP taking these medications    meloxicam 15 MG tablet Commonly known as: MOBIC   multivitamin with minerals tablet       TAKE these medications    acetaminophen  325 MG tablet Commonly known as: TYLENOL  Take 650 mg by mouth every 6 (six) hours as needed for moderate pain (pain score 4-6).   CareTouch 2 CPAP Hose [employer] Misc CPAP   docusate sodium  100 MG capsule Commonly known  as: COLACE Take 1 capsule (100 mg total) by mouth 2 (two) times daily.   famotidine -calcium carbonate-magnesium  hydroxide 10-800-165 MG chewable tablet Commonly known as: PEPCID  COMPLETE Chew 1 tablet by mouth daily.   fexofenadine-pseudoephedrine 180-240 MG 24 hr tablet Commonly known as: ALLEGRA-D 24 Take 1 tablet by mouth daily as needed (allergies).   Mucinex 600 MG 12 hr tablet Generic drug: guaiFENesin Take 600 mg by mouth daily as needed for cough or to loosen phlegm.   Psyllium 55.6 % Powd Take 10 mLs by mouth daily. Colon Health   sulfamethoxazole -trimethoprim  800-160 MG tablet Commonly known as: BACTRIM  DS Take 1 tablet by mouth 2 (two) times daily. Start the day prior to foley removal appointment   traMADol  50 MG tablet Commonly known as: Ultram  Take 1-2 tablets (50-100 mg total) by mouth every 6 (six) hours as needed for moderate pain (pain score 4-6) or severe pain (pain score 7-10).        Followup: He will followup in 1 week for catheter removal and to discuss his surgical pathology results.
